# Patient Record
Sex: Male | Born: 1950 | Race: White | Hispanic: No | Marital: Single | Smoking: Former smoker
Health system: Southern US, Community
[De-identification: ages and names within clinical notes are randomized; demographics above are authoritative.]

## PROBLEM LIST (undated history)

## (undated) DIAGNOSIS — K2289 Other specified disease of esophagus: Secondary | ICD-10-CM

## (undated) DIAGNOSIS — I10 Essential (primary) hypertension: Secondary | ICD-10-CM

## (undated) DIAGNOSIS — K219 Gastro-esophageal reflux disease without esophagitis: Secondary | ICD-10-CM

## (undated) DIAGNOSIS — K228 Other specified diseases of esophagus: Secondary | ICD-10-CM

## (undated) DIAGNOSIS — H9190 Unspecified hearing loss, unspecified ear: Secondary | ICD-10-CM

## (undated) HISTORY — PX: EYE SURGERY: SHX253

---

## 2007-09-09 ENCOUNTER — Ambulatory Visit (HOSPITAL_COMMUNITY): Admission: RE | Admit: 2007-09-09 | Discharge: 2007-09-09 | Payer: Self-pay | Admitting: Family Medicine

## 2018-04-04 ENCOUNTER — Other Ambulatory Visit: Payer: Self-pay

## 2018-04-04 ENCOUNTER — Encounter (HOSPITAL_COMMUNITY): Payer: Self-pay | Admitting: *Deleted

## 2018-04-04 ENCOUNTER — Emergency Department (HOSPITAL_COMMUNITY): Payer: Medicare Other

## 2018-04-04 ENCOUNTER — Emergency Department (HOSPITAL_COMMUNITY)
Admission: EM | Admit: 2018-04-04 | Discharge: 2018-04-04 | Disposition: A | Discharge status: Home / Self Care | Payer: Medicare Other | Source: Home / Self Care | Attending: Emergency Medicine | Admitting: Emergency Medicine

## 2018-04-04 DIAGNOSIS — R634 Abnormal weight loss: Secondary | ICD-10-CM | POA: Insufficient documentation

## 2018-04-04 DIAGNOSIS — Z79899 Other long term (current) drug therapy: Secondary | ICD-10-CM

## 2018-04-04 DIAGNOSIS — C3490 Malignant neoplasm of unspecified part of unspecified bronchus or lung: Secondary | ICD-10-CM | POA: Insufficient documentation

## 2018-04-04 DIAGNOSIS — K228 Other specified diseases of esophagus: Secondary | ICD-10-CM | POA: Insufficient documentation

## 2018-04-04 DIAGNOSIS — R05 Cough: Secondary | ICD-10-CM | POA: Insufficient documentation

## 2018-04-04 DIAGNOSIS — K222 Esophageal obstruction: Secondary | ICD-10-CM | POA: Diagnosis not present

## 2018-04-04 DIAGNOSIS — R918 Other nonspecific abnormal finding of lung field: Secondary | ICD-10-CM

## 2018-04-04 DIAGNOSIS — Z87891 Personal history of nicotine dependence: Secondary | ICD-10-CM | POA: Insufficient documentation

## 2018-04-04 DIAGNOSIS — K229 Disease of esophagus, unspecified: Secondary | ICD-10-CM

## 2018-04-04 DIAGNOSIS — E860 Dehydration: Secondary | ICD-10-CM | POA: Diagnosis not present

## 2018-04-04 DIAGNOSIS — D491 Neoplasm of unspecified behavior of respiratory system: Secondary | ICD-10-CM

## 2018-04-04 HISTORY — DX: Unspecified hearing loss, unspecified ear: H91.90

## 2018-04-04 LAB — CBC
HEMATOCRIT: 50.7 % (ref 39.0–52.0)
HEMOGLOBIN: 16.7 g/dL (ref 13.0–17.0)
MCH: 32 pg (ref 26.0–34.0)
MCHC: 32.9 g/dL (ref 30.0–36.0)
MCV: 97.1 fL (ref 78.0–100.0)
Platelets: 228 10*3/uL (ref 150–400)
RBC: 5.22 MIL/uL (ref 4.22–5.81)
RDW: 12.5 % (ref 11.5–15.5)
WBC: 9.2 10*3/uL (ref 4.0–10.5)

## 2018-04-04 LAB — COMPREHENSIVE METABOLIC PANEL
ALBUMIN: 4 g/dL (ref 3.5–5.0)
ALT: 23 U/L (ref 17–63)
ANION GAP: 13 (ref 5–15)
AST: 19 U/L (ref 15–41)
Alkaline Phosphatase: 84 U/L (ref 38–126)
BILIRUBIN TOTAL: 1.3 mg/dL — AB (ref 0.3–1.2)
BUN: 26 mg/dL — ABNORMAL HIGH (ref 6–20)
CO2: 28 mmol/L (ref 22–32)
Calcium: 10.4 mg/dL — ABNORMAL HIGH (ref 8.9–10.3)
Chloride: 104 mmol/L (ref 101–111)
Creatinine, Ser: 1.05 mg/dL (ref 0.61–1.24)
GFR calc Af Amer: >60 mL/min (ref >60)
Glucose, Bld: 115 mg/dL — ABNORMAL HIGH (ref 65–99)
POTASSIUM: 4.3 mmol/L (ref 3.5–5.1)
Sodium: 145 mmol/L (ref 135–145)
TOTAL PROTEIN: 7.7 g/dL (ref 6.5–8.1)

## 2018-04-04 LAB — URINALYSIS, ROUTINE W REFLEX MICROSCOPIC
BACTERIA UA: NONE SEEN
Bilirubin Urine: NEGATIVE
GLUCOSE, UA: NEGATIVE mg/dL
HGB URINE DIPSTICK: NEGATIVE
KETONES UR: 20 mg/dL — AB
LEUKOCYTES UA: NEGATIVE
NITRITE: NEGATIVE
PROTEIN: 30 mg/dL — AB
Specific Gravity, Urine: 1.026 (ref 1.005–1.030)
pH: 5 (ref 5.0–8.0)

## 2018-04-04 LAB — LIPASE, BLOOD: Lipase: 32 U/L (ref 11–51)

## 2018-04-04 MED ORDER — IOPAMIDOL (ISOVUE-300) INJECTION 61%
100.0000 mL | Freq: Once | INTRAVENOUS | Status: AC | PRN
  Administered 2018-04-04: 100 mL via INTRAVENOUS

## 2018-04-04 MED ORDER — OMEPRAZOLE 20 MG PO CPDR: 20.0000 mg | DELAYED_RELEASE_CAPSULE | Freq: Two times a day (BID) | ORAL | 0 refills | Status: DC

## 2018-04-04 NOTE — ED Triage Notes (Signed)
Pt "spitting up" phlegm couple of months.  Pt lives alone at home, pt admits to eating crackers and has become weaker, denies diarrhea.

## 2018-04-04 NOTE — ED Notes (Signed)
Patient transported to X-ray 

## 2018-04-04 NOTE — ED Notes (Signed)
Unable to reach case management, voicemail full.  Attempted central scheduling.  Dr. Jeneen Rinks wanted to make sure this pt's biopsy did not get postponed or missed due to scheduling error. Pt informed I will contact him tomorrow to ensure he was able to reach central scheduling for arrangements.

## 2018-04-04 NOTE — ED Provider Notes (Signed)
[facility] EMERGENCY DEPARTMENT Provider Note   CSN: [id number] Arrival date & time: 04/04/18  1221     History   Chief Complaint Chief Complaint  Patient presents with  . Emesis    HPI Joshua Schwartz is a 67 y.o. male.  Complaint is weight loss, cough, poor appetite.  HPI: This is a 67 year old male who presents with the above multiple complaints.  He is a heavy smoker with greater than 50+-pack-year history.  He started "vaping" a few months ago in an attempt to stop smoking.  Describes 40 pound weight loss over the last "couple of months".  No hemoptysis.  No night sweats.  Poor appetite.  He states he has a chronic productive cough of clear sputum.  No emesis.  However he states that when he eats or drinks he feels full and occasionally does vomit.  Normal bowel habits.  Still urinating.  Past Medical History:  Diagnosis Date  . HOH (hard of hearing)     There are no active problems to display for this patient.   Past Surgical History:  Procedure Laterality Date  . EYE SURGERY          Home Medications    Prior to Admission medications   Medication Sig Start Date End Date Taking? Authorizing Provider  Aspirin-Acetaminophen-Caffeine (EXCEDRIN PO) Take 1 tablet by mouth every 6 (six) hours.   Yes [provider]  ibuprofen (ADVIL,MOTRIN) 200 MG tablet Take 200 mg by mouth every 6 (six) hours as needed.   Yes [provider]  omeprazole (PRILOSEC) 20 MG capsule Take 1 capsule (20 mg total) by mouth 2 (two) times daily. 04/04/18   Tanna Furry, MD    Family History History reviewed. No pertinent family history.  Social History Social History   Tobacco Use  . Smoking status: Former Research scientist (life sciences)  . Smokeless tobacco: Never Used  Substance Use Topics  . Alcohol use: Not Currently    Frequency: Never  . Drug use: Never     Allergies   Codeine   Review of Systems Review of Systems  Constitutional: Positive for activity change, appetite change,  fatigue and unexpected weight change. Negative for chills, diaphoresis and fever.  HENT: Positive for congestion. Negative for mouth sores, sore throat and trouble swallowing.   Eyes: Negative for visual disturbance.  Respiratory: Positive for cough. Negative for chest tightness, shortness of breath and wheezing.   Cardiovascular: Negative for chest pain.  Gastrointestinal: Positive for nausea and vomiting. Negative for abdominal distention, abdominal pain and diarrhea.  Endocrine: Negative for polydipsia, polyphagia and polyuria.  Genitourinary: Negative for dysuria, frequency and hematuria.  Musculoskeletal: Negative for gait problem.  Skin: Negative for color change, pallor and rash.  Neurological: Negative for dizziness, syncope, light-headedness and headaches.  Hematological: Does not bruise/bleed easily.  Psychiatric/Behavioral: Negative for behavioral problems and confusion.     Physical Exam Updated Vital Signs BP (!) 184/110 Comment: checked twice for accuracy.   Pulse 66   Temp 97.8 F (36.6 C) (Oral)   Resp 16   Ht 5\' 5"  (1.651 m)   Wt 75.8 kg (167 lb)   SpO2 97%   BMI 27.79 kg/m   Physical Exam  Constitutional: He is oriented to person, place, and time. No distress.  Thin.  Cachectic appearing 67 year old male.  Awake and alert.  Hard of hearing.  Wears hearing aids.  HENT:  Head: Normocephalic.  Conjunctive are not pale.  No scleral icterus  Eyes: Pupils are equal, round,  and reactive to light. Conjunctivae are normal. No scleral icterus.  Neck: Normal range of motion. Neck supple. No thyromegaly present.  Cardiovascular: Normal rate and regular rhythm. Exam reveals no gallop and no friction rub.  No murmur heard. Regular rhythm.  Pulmonary/Chest: Effort normal and breath sounds normal. No respiratory distress. He has no wheezes. He has no rales.  No crackles or rales.  No palpable lymph nodes in the neck, supraclavicular chain, or axilla  Abdominal: Soft. Bowel  sounds are normal. He exhibits no distension. There is no tenderness. There is no rebound.  Scaphoid soft.  No focal tenderness  Musculoskeletal: Normal range of motion.  Neurological: He is alert and oriented to person, place, and time.  Skin: Skin is warm and dry. No rash noted.  Psychiatric: He has a normal mood and affect. His behavior is normal.     ED Treatments / Results  Labs (all labs ordered are listed, but only abnormal results are displayed) Labs Reviewed  COMPREHENSIVE METABOLIC PANEL - Abnormal; Notable for the following components:      Result Value   Glucose, Bld 115 (*)    BUN 26 (*)    Calcium 10.4 (*)    Total Bilirubin 1.3 (*)    All other components within normal limits  URINALYSIS, ROUTINE W REFLEX MICROSCOPIC - Abnormal; Notable for the following components:   Color, Urine AMBER (*)    APPearance HAZY (*)    Ketones, ur 20 (*)    Protein, ur 30 (*)    All other components within normal limits  LIPASE, BLOOD  CBC    EKG None  Radiology Dg Chest 2 View  Result Date: 04/04/2018 CLINICAL DATA:  Cough, weight loss. EXAM: CHEST - 2 VIEW COMPARISON:  None. FINDINGS: 6 cm mass within the RIGHT upper lobe. LEFT lung is clear. Prominence of the RIGHT hilum could be due to associated lymphadenopathy or ectasia of the ascending thoracic aorta. Heart size is normal. No acute or suspicious osseous finding. IMPRESSION: RIGHT upper lobe mass measuring approximately 6 cm greatest dimension. This is almost certainly a lung cancer. Recommend chest CT for further characterization and to exclude associated mediastinal and/or perihilar lymphadenopathy. Electronically Signed   By: Franki Cabot M.D.   On: 04/04/2018 13:25   Ct Chest W Contrast  Result Date: 04/04/2018 CLINICAL DATA:  Productive cough for the last several months. Worsening weakness. Weight loss. EXAM: CT CHEST, ABDOMEN, AND PELVIS WITH CONTRAST TECHNIQUE: Multidetector CT imaging of the chest, abdomen and pelvis  was performed following the standard protocol during bolus administration of intravenous contrast. CONTRAST:  135mL ISOVUE-300 IOPAMIDOL (ISOVUE-300) INJECTION 61% COMPARISON:  Radiography same day. FINDINGS: CT CHEST FINDINGS Cardiovascular: Heart size is normal. No pericardial fluid. There is aortic atherosclerosis. There is aneurysmal dilatation of the ascending aorta with a maximal transverse diameter of 4.4 cm. There is extensive coronary artery calcification. Mediastinum/Nodes: There is right hilar lymphadenopathy measuring up to 3 cm. No mediastinal adenopathy. Esophagus is dilated and fluid-filled. There appears to be esophageal thickening distally. The possibility of esophageal carcinoma or stoppage I today is does exist. Lungs/Pleura: There is a partially necrotic peripheral right upper lobe mass measuring up to 5 cm in diameter consistent with primary lung cancer. There is benign appearing pleural and parenchymal scarring at both lung apices the tumor has a broad surface along the pleura. At 1 point, 1 could question if there is some nodular tumor extension into the region of the parietal pleura. Patient does  not have any pleural fluid however. Musculoskeletal: Ordinary degenerative changes affect the thoracic spine. No sign of metastatic disease in the thorax. CT ABDOMEN PELVIS FINDINGS Hepatobiliary: Fatty change of the liver. No focal liver lesion is seen. No calcified gallstones. Pancreas: Normal Spleen: Normal Adrenals/Urinary Tract: No adrenal mass. Kidneys are normal. Bladder is normal. Stomach/Bowel: Sigmoid diverticulosis without evidence of diverticulitis. No acute or significant bowel finding in the abdomen. Vascular/Lymphatic: Extensive atherosclerotic disease of the abdominal aorta and its branch vessels. Infrarenal abdominal aortic aneurysm with maximal transverse diameter of 2.8 cm. 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 Maximal diameter on the  right is 1.7 cm. IVC is normal. No retroperitoneal adenopathy. Reproductive: Negative Other: No free fluid or air. Musculoskeletal: Lower lumbar degenerative changes. No sign of metastatic disease in the lumbosacral spine or the pelvis. IMPRESSION: 5 cm partially necrotic mass in the posterior right upper lobe with a broad surface along the pleura. Question nodular pleural invasion. No pleural fluid however. Metastatic adenopathy in the right hilum measuring up to 3 cm. Thickening of the distal esophagus with dilated fluid filled esophagus proximal to that. This raises the possibility of a distal esophageal carcinoma. Findings could be seen with esophagitis. Aortic atherosclerosis. 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 Coronary artery calcification. Abdominal aortic atherosclerosis. Infrarenal abdominal aortic aneurysm with maximal diameter of 2.8 cm. Electronically Signed   By: Nelson Chimes M.D.   On: 04/04/2018 14:47   Ct Abdomen Pelvis W Contrast  Result Date: 04/04/2018 CLINICAL DATA:  Productive cough for the last several months. Worsening weakness. Weight loss. EXAM: CT CHEST, ABDOMEN, AND PELVIS WITH CONTRAST TECHNIQUE: Multidetector CT imaging of the chest, abdomen and pelvis was performed following the standard protocol during bolus administration of intravenous contrast. CONTRAST:  150mL ISOVUE-300 IOPAMIDOL (ISOVUE-300) INJECTION 61% COMPARISON:  Radiography same day. FINDINGS: CT CHEST FINDINGS Cardiovascular: Heart size is normal. No pericardial fluid. There is aortic atherosclerosis. There is aneurysmal dilatation of the ascending aorta with a maximal transverse diameter of 4.4 cm. There is extensive coronary artery calcification. Mediastinum/Nodes: There is right hilar lymphadenopathy measuring up to 3 cm. No mediastinal adenopathy. Esophagus is dilated and fluid-filled. There appears to be esophageal thickening distally. The possibility of esophageal carcinoma or stoppage I today  is does exist. Lungs/Pleura: There is a partially necrotic peripheral right upper lobe mass measuring up to 5 cm in diameter consistent with primary lung cancer. There is benign appearing pleural and parenchymal scarring at both lung apices the tumor has a broad surface along the pleura. At 1 point, 1 could question if there is some nodular tumor extension into the region of the parietal pleura. Patient does not have any pleural fluid however. Musculoskeletal: Ordinary degenerative changes affect the thoracic spine. No sign of metastatic disease in the thorax. CT ABDOMEN PELVIS FINDINGS Hepatobiliary: Fatty change of the liver. No focal liver lesion is seen. No calcified gallstones. Pancreas: Normal Spleen: Normal Adrenals/Urinary Tract: No adrenal mass. Kidneys are normal. Bladder is normal. Stomach/Bowel: Sigmoid diverticulosis without evidence of diverticulitis. No acute or significant bowel finding in the abdomen. Vascular/Lymphatic: Extensive atherosclerotic disease of the abdominal aorta and its branch vessels. Infrarenal abdominal aortic aneurysm with maximal transverse diameter of 2.8 cm. 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 Maximal diameter on the right is 1.7 cm. IVC is normal. No retroperitoneal adenopathy. Reproductive: Negative Other: No free fluid or air. Musculoskeletal: Lower lumbar degenerative changes. No sign of  metastatic disease in the lumbosacral spine or the pelvis. IMPRESSION: 5 cm partially necrotic mass in the posterior right upper lobe with a broad surface along the pleura. Question nodular pleural invasion. No pleural fluid however. Metastatic adenopathy in the right hilum measuring up to 3 cm. Thickening of the distal esophagus with dilated fluid filled esophagus proximal to that. This raises the possibility of a distal esophageal carcinoma. Findings could be seen with esophagitis. Aortic atherosclerosis. Aneurysmal dilatation of the  ascending aorta to 4.4 cm. Coronary artery calcification. Abdominal aortic atherosclerosis. Infrarenal abdominal aortic aneurysm with maximal diameter of 2.8 cm. Electronically Signed   By: Nelson Chimes M.D.   On: 04/04/2018 14:47    Procedures Procedures (including critical care time)  Medications Ordered in ED Medications  iopamidol (ISOVUE-300) 61 % injection 100 mL (100 mLs Intravenous Contrast Given 04/04/18 1418)     Initial Impression / Assessment and Plan / ED Course  I have reviewed the triage vital signs and the nursing notes.  Pertinent labs & imaging results that were available during my care of the patient were reviewed by me and considered in my medical decision making (see chart for details).    Weight loss and B symptoms and heavy smoker.  Concern for lung disease.  X-ray unfortunately shows a round right upper lobe 6 cm mass.  CT confirms probable malignancy with mediastinal nodes.  Thickening of the distal esophagus with some pooling.  He is able to drink liquids in the ER.  Does have an occasional cough of some clear sputum.  I have made multiple attempts to try to secure a time for patient to return for CT-guided biopsy of his pleural-based right lower lobe mass.  I discussed the case with Dr.Katragadda, college he had any pain.  They will contact the patient for appointment.  Ordered IR guided CT biopsy.  Number for IR department at [facility].  We will follow-up from here tomorrow, Monday/weekday to try to ensure follow-up and secure appointments of the patient can have biopsy to initiate early treatment.  I discussed with him the possibility of a second diagnosis of esophageal cancer.  He is able to swallow although he does occasionally pull some secretions as noted on CT.  No signs of aspiration.  Will and institute Prilosec.  Maintain liquids and low residue diet to avoid impaction.  GI follow-up.  Oncology follow-up.  Final Clinical Impressions(s) / ED Diagnoses   Final  diagnoses:  Lung mass  Lung tumor  Esophagus disorder    ED Discharge Orders        Ordered    omeprazole (PRILOSEC) 20 MG capsule  2 times daily     04/04/18 1656       Tanna Furry, MD 04/04/18 2040

## 2018-04-04 NOTE — Discharge Instructions (Signed)
Call Dr. Delton Coombes tomorrow to make appointment. You should receive a call from [facility] radiology department tomorrow to schedule you a time for a biopsy. Fill Rx for prilosec and take twice per day.

## 2018-04-04 NOTE — ED Notes (Signed)
Pt states that he lost about 40 lbs in the last two months.

## 2018-04-04 NOTE — ED Notes (Signed)
Pt returned from CT °

## 2018-04-05 ENCOUNTER — Inpatient Hospital Stay (HOSPITAL_COMMUNITY)
Admission: EM | Admit: 2018-04-05 | Discharge: 2018-04-08 | DRG: 391 | Disposition: A | Discharge status: Home / Self Care | Payer: Medicare Other | Attending: Family Medicine | Admitting: Family Medicine

## 2018-04-05 ENCOUNTER — Other Ambulatory Visit: Payer: Self-pay

## 2018-04-05 ENCOUNTER — Encounter (HOSPITAL_COMMUNITY): Payer: Self-pay | Admitting: Emergency Medicine

## 2018-04-05 DIAGNOSIS — H9190 Unspecified hearing loss, unspecified ear: Secondary | ICD-10-CM | POA: Diagnosis present

## 2018-04-05 DIAGNOSIS — K311 Adult hypertrophic pyloric stenosis: Secondary | ICD-10-CM | POA: Diagnosis not present

## 2018-04-05 DIAGNOSIS — F17210 Nicotine dependence, cigarettes, uncomplicated: Secondary | ICD-10-CM | POA: Diagnosis present

## 2018-04-05 DIAGNOSIS — E43 Unspecified severe protein-calorie malnutrition: Secondary | ICD-10-CM | POA: Diagnosis present

## 2018-04-05 DIAGNOSIS — Z23 Encounter for immunization: Secondary | ICD-10-CM

## 2018-04-05 DIAGNOSIS — Z720 Tobacco use: Secondary | ICD-10-CM | POA: Diagnosis not present

## 2018-04-05 DIAGNOSIS — R634 Abnormal weight loss: Secondary | ICD-10-CM

## 2018-04-05 DIAGNOSIS — K222 Esophageal obstruction: Secondary | ICD-10-CM

## 2018-04-05 DIAGNOSIS — I712 Thoracic aortic aneurysm, without rupture: Secondary | ICD-10-CM | POA: Diagnosis present

## 2018-04-05 DIAGNOSIS — R1310 Dysphagia, unspecified: Secondary | ICD-10-CM

## 2018-04-05 DIAGNOSIS — T18128A Food in esophagus causing other injury, initial encounter: Secondary | ICD-10-CM | POA: Diagnosis not present

## 2018-04-05 DIAGNOSIS — E860 Dehydration: Secondary | ICD-10-CM | POA: Diagnosis present

## 2018-04-05 DIAGNOSIS — I719 Aortic aneurysm of unspecified site, without rupture: Secondary | ICD-10-CM

## 2018-04-05 DIAGNOSIS — I714 Abdominal aortic aneurysm, without rupture: Secondary | ICD-10-CM | POA: Diagnosis present

## 2018-04-05 DIAGNOSIS — K219 Gastro-esophageal reflux disease without esophagitis: Secondary | ICD-10-CM

## 2018-04-05 DIAGNOSIS — I10 Essential (primary) hypertension: Secondary | ICD-10-CM | POA: Diagnosis present

## 2018-04-05 DIAGNOSIS — Z7982 Long term (current) use of aspirin: Secondary | ICD-10-CM

## 2018-04-05 DIAGNOSIS — Z681 Body mass index (BMI) 19 or less, adult: Secondary | ICD-10-CM | POA: Diagnosis not present

## 2018-04-05 DIAGNOSIS — Z87891 Personal history of nicotine dependence: Secondary | ICD-10-CM | POA: Diagnosis not present

## 2018-04-05 DIAGNOSIS — R112 Nausea with vomiting, unspecified: Secondary | ICD-10-CM

## 2018-04-05 DIAGNOSIS — R918 Other nonspecific abnormal finding of lung field: Secondary | ICD-10-CM | POA: Diagnosis not present

## 2018-04-05 DIAGNOSIS — Z885 Allergy status to narcotic agent status: Secondary | ICD-10-CM | POA: Diagnosis not present

## 2018-04-05 HISTORY — DX: Other specified disease of esophagus: K22.89

## 2018-04-05 HISTORY — DX: Other specified diseases of esophagus: K22.8

## 2018-04-05 HISTORY — DX: Gastro-esophageal reflux disease without esophagitis: K21.9

## 2018-04-05 HISTORY — DX: Essential (primary) hypertension: I10

## 2018-04-05 LAB — COMPREHENSIVE METABOLIC PANEL
ALBUMIN: 4.1 g/dL (ref 3.5–5.0)
ALK PHOS: 87 U/L (ref 38–126)
ALT: 28 U/L (ref 17–63)
AST: 28 U/L (ref 15–41)
Anion gap: 15 (ref 5–15)
BUN: 33 mg/dL — AB (ref 6–20)
CALCIUM: 10.4 mg/dL — AB (ref 8.9–10.3)
CO2: 26 mmol/L (ref 22–32)
CREATININE: 1.14 mg/dL (ref 0.61–1.24)
Chloride: 107 mmol/L (ref 101–111)
GFR calc Af Amer: >60 mL/min (ref >60)
GFR calc non Af Amer: >60 mL/min (ref >60)
GLUCOSE: 106 mg/dL — AB (ref 65–99)
Potassium: 4.4 mmol/L (ref 3.5–5.1)
SODIUM: 148 mmol/L — AB (ref 135–145)
Total Bilirubin: 1.2 mg/dL (ref 0.3–1.2)
Total Protein: 7.4 g/dL (ref 6.5–8.1)

## 2018-04-05 LAB — CBC WITH DIFFERENTIAL/PLATELET
Basophils Absolute: 0.1 10*3/uL (ref 0.0–0.1)
Basophils Relative: 1 %
EOS ABS: 0.1 10*3/uL (ref 0.0–0.7)
Eosinophils Relative: 1 %
HCT: 48.4 % (ref 39.0–52.0)
HEMOGLOBIN: 15.7 g/dL (ref 13.0–17.0)
LYMPHS ABS: 0.9 10*3/uL (ref 0.7–4.0)
Lymphocytes Relative: 9 %
MCH: 31.8 pg (ref 26.0–34.0)
MCHC: 32.4 g/dL (ref 30.0–36.0)
MCV: 98 fL (ref 78.0–100.0)
Monocytes Absolute: 0.7 10*3/uL (ref 0.1–1.0)
Monocytes Relative: 7 %
NEUTROS PCT: 82 %
Neutro Abs: 8.4 10*3/uL — ABNORMAL HIGH (ref 1.7–7.7)
Platelets: 233 10*3/uL (ref 150–400)
RBC: 4.94 MIL/uL (ref 4.22–5.81)
RDW: 12.7 % (ref 11.5–15.5)
WBC: 10.1 10*3/uL (ref 4.0–10.5)

## 2018-04-05 MED ORDER — ACETAMINOPHEN 325 MG PO TABS
650.0000 mg | ORAL_TABLET | Freq: Four times a day (QID) | ORAL | Status: DC | PRN
  Administered 2018-04-06 – 2018-04-08 (×5): 650 mg via ORAL
  Filled 2018-04-05 (×5): qty 2

## 2018-04-05 MED ORDER — ACETAMINOPHEN 650 MG RE SUPP: 650.0000 mg | Freq: Four times a day (QID) | RECTAL | Status: DC | PRN

## 2018-04-05 MED ORDER — FAMOTIDINE IN NACL 20-0.9 MG/50ML-% IV SOLN
20.0000 mg | Freq: Two times a day (BID) | INTRAVENOUS | Status: DC
  Administered 2018-04-05 – 2018-04-08 (×6): 20 mg via INTRAVENOUS
  Filled 2018-04-05 (×6): qty 50

## 2018-04-05 MED ORDER — ONDANSETRON HCL 4 MG/2ML IJ SOLN
4.0000 mg | Freq: Four times a day (QID) | INTRAMUSCULAR | Status: DC | PRN
  Administered 2018-04-05: 4 mg via INTRAVENOUS
  Filled 2018-04-05: qty 2

## 2018-04-05 MED ORDER — HYDRALAZINE HCL 20 MG/ML IJ SOLN
10.0000 mg | INTRAMUSCULAR | Status: DC | PRN
  Administered 2018-04-05 – 2018-04-07 (×2): 10 mg via INTRAVENOUS
  Filled 2018-04-05 (×2): qty 1

## 2018-04-05 MED ORDER — NICOTINE 7 MG/24HR TD PT24
7.0000 mg | MEDICATED_PATCH | Freq: Every day | TRANSDERMAL | Status: DC
  Administered 2018-04-05 – 2018-04-08 (×4): 7 mg via TRANSDERMAL
  Filled 2018-04-05 (×4): qty 1

## 2018-04-05 MED ORDER — LACTATED RINGERS IV SOLN
INTRAVENOUS | Status: AC
  Administered 2018-04-05: via INTRAVENOUS

## 2018-04-05 MED ORDER — ONDANSETRON HCL 4 MG PO TABS
4.0000 mg | ORAL_TABLET | Freq: Four times a day (QID) | ORAL | Status: DC | PRN
  Administered 2018-04-08: 4 mg via ORAL
  Filled 2018-04-05: qty 1

## 2018-04-05 MED ORDER — SODIUM CHLORIDE 0.9 % IV BOLUS
1000.0000 mL | Freq: Once | INTRAVENOUS | Status: AC
  Administered 2018-04-05: 1000 mL via INTRAVENOUS

## 2018-04-05 NOTE — ED Triage Notes (Signed)
Pt has not been able to keep anything down since being discharged yesterday.  Was dx with lung mass yesterday by Dr. Jeneen Rinks and is supposed to schedule biopsy.

## 2018-04-05 NOTE — ED Notes (Signed)
Apresoline given for BP of 175/115

## 2018-04-05 NOTE — H&P (Addendum)
History and Physical    Joshua Schwartz UVO:[id number] DOB: 1951-01-01 DOA: 04/05/2018  PCP: Patient, No Pcp Per   Patient coming from: Home  Chief Complaint: N/V  HPI: Joshua Schwartz is a 67 y.o. male with medical history significant for significant tobacco abuse and GERD who presented to the ED yesterday with complaints of nausea and vomiting along with unintentional weight loss as well as poor appetite and cough.  He had multiple imaging studies done with CT chest as well as abdomen and pelvis demonstrating round right upper lobe 6 cm mass as well as possible malignancy with mediastinal nodes with concern of distal esophagitis.  There was also noted aneurysm of the ascending and descending aorta.  He was being set up for outpatient CT-guided biopsy and was sent home, but unfortunately continues to have difficulty with oral intake of both solids and liquids and has a persistent burning sensation to his substernal chest and epigastric region.  He has not been able to keep anything down and therefore returns to the ED.   ED Course: Vital signs are stable, but patient has elevated blood pressure readings of 742 systolic and over 595 diastolic.  Laboratory data unremarkable aside from mild sodium elevation of 148 and BUN 33.  Creatinine appears to be near baseline at 1.14 with prior 1.05.  Glucose is 106.  Imaging studies were reviewed with findings of right upper lobe mass as well as thickening of distal esophagus and aortic aneurysm is noted.  Patient has been given 1 L normal saline bolus.  Review of Systems: All others reviewed and otherwise negative.  Past Medical History:  Diagnosis Date  . HOH (hard of hearing)     Past Surgical History:  Procedure Laterality Date  . EYE SURGERY       reports that he has quit smoking. He has never used smokeless tobacco. He reports that he drank alcohol. He reports that he does not use drugs.  Allergies  Allergen Reactions  . Codeine     Altered mental  status    History reviewed. No pertinent family history.  Prior to Admission medications   Medication Sig Start Date End Date Taking? Authorizing Provider  Aspirin-Acetaminophen-Caffeine (EXCEDRIN PO) Take 1 tablet by mouth every 6 (six) hours.   Yes [provider]  ibuprofen (ADVIL,MOTRIN) 200 MG tablet Take 200 mg by mouth every 6 (six) hours as needed.   Yes [provider]  omeprazole (PRILOSEC) 20 MG capsule Take 1 capsule (20 mg total) by mouth 2 (two) times daily. 04/04/18  Yes Tanna Furry, MD    Physical Exam: Vitals:   04/05/18 1325 04/05/18 1530 04/05/18 1630 04/05/18 1730  BP: (!) 184/114 (!) 187/117 (!) 183/122 (!) 171/119  Pulse: 91 78  76  Resp: 16 14  18   Temp: 97.8 F (36.6 C)     TempSrc: Oral     SpO2: 99% 97%  99%  Weight: 45.3 kg (99 lb 14.4 oz)     Height: 5\' 5"  (1.651 m)       Constitutional: NAD, calm, comfortable, cachectic Vitals:   04/05/18 1325 04/05/18 1530 04/05/18 1630 04/05/18 1730  BP: (!) 184/114 (!) 187/117 (!) 183/122 (!) 171/119  Pulse: 91 78  76  Resp: 16 14  18   Temp: 97.8 F (36.6 C)     TempSrc: Oral     SpO2: 99% 97%  99%  Weight: 45.3 kg (99 lb 14.4 oz)     Height: 5\' 5"  (1.651  m)      Eyes: lids and conjunctivae normal ENMT: Mucous membranes are moist.  Hard of hearing Neck: normal, supple Respiratory: clear to auscultation bilaterally. Normal respiratory effort. No accessory muscle use.  Cardiovascular: Regular rate and rhythm, no murmurs. No extremity edema. Abdomen: no tenderness, no distention. Bowel sounds positive.  Musculoskeletal:  No joint deformity upper and lower extremities.   Skin: no rashes, lesions, ulcers.   Labs on Admission: I have personally reviewed following labs and imaging studies  CBC: Recent Labs  Lab 04/04/18 1245 04/05/18 1513  WBC 9.2 10.1  NEUTROABS  --  8.4*  HGB 16.7 15.7  HCT 50.7 48.4  MCV 97.1 98.0  PLT 228 269   Basic Metabolic Panel: Recent Labs  Lab  04/04/18 1245 04/05/18 1513  NA 145 148*  K 4.3 4.4  CL 104 107  CO2 28 26  GLUCOSE 115* 106*  BUN 26* 33*  CREATININE 1.05 1.14  CALCIUM 10.4* 10.4*   GFR: Estimated Creatinine Clearance: 41.4 mL/min (by C-G formula based on SCr of 1.14 mg/dL). Liver Function Tests: Recent Labs  Lab 04/04/18 1245 04/05/18 1513  AST 19 28  ALT 23 28  ALKPHOS 84 87  BILITOT 1.3* 1.2  PROT 7.7 7.4  ALBUMIN 4.0 4.1   Recent Labs  Lab 04/04/18 1245  LIPASE 32   No results for input(s): AMMONIA in the last 168 hours. Coagulation Profile: No results for input(s): INR, PROTIME in the last 168 hours. Cardiac Enzymes: No results for input(s): CKTOTAL, CKMB, CKMBINDEX, TROPONINI in the last 168 hours. BNP (last 3 results) No results for input(s): PROBNP in the last 8760 hours. HbA1C: No results for input(s): HGBA1C in the last 72 hours. CBG: No results for input(s): GLUCAP in the last 168 hours. Lipid Profile: No results for input(s): CHOL, HDL, LDLCALC, TRIG, CHOLHDL, LDLDIRECT in the last 72 hours. Thyroid Function Tests: No results for input(s): TSH, T4TOTAL, FREET4, T3FREE, THYROIDAB in the last 72 hours. Anemia Panel: No results for input(s): VITAMINB12, FOLATE, FERRITIN, TIBC, IRON, RETICCTPCT in the last 72 hours. Urine analysis:    Component Value Date/Time   COLORURINE AMBER (A) 04/04/2018 1400   APPEARANCEUR HAZY (A) 04/04/2018 1400   LABSPEC 1.026 04/04/2018 1400   PHURINE 5.0 04/04/2018 1400   GLUCOSEU NEGATIVE 04/04/2018 1400   HGBUR NEGATIVE 04/04/2018 1400   BILIRUBINUR NEGATIVE 04/04/2018 1400   KETONESUR 20 (A) 04/04/2018 1400   PROTEINUR 30 (A) 04/04/2018 1400   NITRITE NEGATIVE 04/04/2018 1400   LEUKOCYTESUR NEGATIVE 04/04/2018 1400    Radiological Exams on Admission: Dg Chest 2 View  Result Date: 04/04/2018 CLINICAL DATA:  Cough, weight loss. EXAM: CHEST - 2 VIEW COMPARISON:  None. FINDINGS: 6 cm mass within the RIGHT upper lobe. LEFT lung is clear.  Prominence of the RIGHT hilum could be due to associated lymphadenopathy or ectasia of the ascending thoracic aorta. Heart size is normal. No acute or suspicious osseous finding. IMPRESSION: RIGHT upper lobe mass measuring approximately 6 cm greatest dimension. This is almost certainly a lung cancer. Recommend chest CT for further characterization and to exclude associated mediastinal and/or perihilar lymphadenopathy. Electronically Signed   By: Franki Cabot M.D.   On: 04/04/2018 13:25   Ct Chest W Contrast  Result Date: 04/04/2018 CLINICAL DATA:  Productive cough for the last several months. Worsening weakness. Weight loss. EXAM: CT CHEST, ABDOMEN, AND PELVIS WITH CONTRAST TECHNIQUE: Multidetector CT imaging of the chest, abdomen and pelvis was performed following the standard protocol  during bolus administration of intravenous contrast. CONTRAST:  13mL ISOVUE-300 IOPAMIDOL (ISOVUE-300) INJECTION 61% COMPARISON:  Radiography same day. FINDINGS: CT CHEST FINDINGS Cardiovascular: Heart size is normal. No pericardial fluid. There is aortic atherosclerosis. There is aneurysmal dilatation of the ascending aorta with a maximal transverse diameter of 4.4 cm. There is extensive coronary artery calcification. Mediastinum/Nodes: There is right hilar lymphadenopathy measuring up to 3 cm. No mediastinal adenopathy. Esophagus is dilated and fluid-filled. There appears to be esophageal thickening distally. The possibility of esophageal carcinoma or stoppage I today is does exist. Lungs/Pleura: There is a partially necrotic peripheral right upper lobe mass measuring up to 5 cm in diameter consistent with primary lung cancer. There is benign appearing pleural and parenchymal scarring at both lung apices the tumor has a broad surface along the pleura. At 1 point, 1 could question if there is some nodular tumor extension into the region of the parietal pleura. Patient does not have any pleural fluid however. Musculoskeletal:  Ordinary degenerative changes affect the thoracic spine. No sign of metastatic disease in the thorax. CT ABDOMEN PELVIS FINDINGS Hepatobiliary: Fatty change of the liver. No focal liver lesion is seen. No calcified gallstones. Pancreas: Normal Spleen: Normal Adrenals/Urinary Tract: No adrenal mass. Kidneys are normal. Bladder is normal. Stomach/Bowel: Sigmoid diverticulosis without evidence of diverticulitis. No acute or significant bowel finding in the abdomen. Vascular/Lymphatic: Extensive atherosclerotic disease of the abdominal aorta and its branch vessels. Infrarenal abdominal aortic aneurysm with maximal transverse diameter of 2.8 cm. 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 Maximal diameter on the right is 1.7 cm. IVC is normal. No retroperitoneal adenopathy. Reproductive: Negative Other: No free fluid or air. Musculoskeletal: Lower lumbar degenerative changes. No sign of metastatic disease in the lumbosacral spine or the pelvis. IMPRESSION: 5 cm partially necrotic mass in the posterior right upper lobe with a broad surface along the pleura. Question nodular pleural invasion. No pleural fluid however. Metastatic adenopathy in the right hilum measuring up to 3 cm. Thickening of the distal esophagus with dilated fluid filled esophagus proximal to that. This raises the possibility of a distal esophageal carcinoma. Findings could be seen with esophagitis. Aortic atherosclerosis. 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 Coronary artery calcification. Abdominal aortic atherosclerosis. Infrarenal abdominal aortic aneurysm with maximal diameter of 2.8 cm. Electronically Signed   By: Nelson Chimes M.D.   On: 04/04/2018 14:47   Ct Abdomen Pelvis W Contrast  Result Date: 04/04/2018 CLINICAL DATA:  Productive cough for the last several months. Worsening weakness. Weight loss. EXAM: CT CHEST, ABDOMEN, AND PELVIS WITH CONTRAST TECHNIQUE: Multidetector CT imaging  of the chest, abdomen and pelvis was performed following the standard protocol during bolus administration of intravenous contrast. CONTRAST:  118mL ISOVUE-300 IOPAMIDOL (ISOVUE-300) INJECTION 61% COMPARISON:  Radiography same day. FINDINGS: CT CHEST FINDINGS Cardiovascular: Heart size is normal. No pericardial fluid. There is aortic atherosclerosis. There is aneurysmal dilatation of the ascending aorta with a maximal transverse diameter of 4.4 cm. There is extensive coronary artery calcification. Mediastinum/Nodes: There is right hilar lymphadenopathy measuring up to 3 cm. No mediastinal adenopathy. Esophagus is dilated and fluid-filled. There appears to be esophageal thickening distally. The possibility of esophageal carcinoma or stoppage I today is does exist. Lungs/Pleura: There is a partially necrotic peripheral right upper lobe mass measuring up to 5 cm in diameter consistent with primary lung cancer. There is benign appearing pleural and parenchymal scarring at both lung apices the tumor has a broad surface  along the pleura. At 1 point, 1 could question if there is some nodular tumor extension into the region of the parietal pleura. Patient does not have any pleural fluid however. Musculoskeletal: Ordinary degenerative changes affect the thoracic spine. No sign of metastatic disease in the thorax. CT ABDOMEN PELVIS FINDINGS Hepatobiliary: Fatty change of the liver. No focal liver lesion is seen. No calcified gallstones. Pancreas: Normal Spleen: Normal Adrenals/Urinary Tract: No adrenal mass. Kidneys are normal. Bladder is normal. Stomach/Bowel: Sigmoid diverticulosis without evidence of diverticulitis. No acute or significant bowel finding in the abdomen. Vascular/Lymphatic: Extensive atherosclerotic disease of the abdominal aorta and its branch vessels. Infrarenal abdominal aortic aneurysm with maximal transverse diameter of 2.8 cm. Extensive iliac atherosclerotic disease with ectasia of the common carotid  arteries. Maximal diameter on the right is 1.7 cm. IVC is normal. No retroperitoneal adenopathy. Reproductive: Negative Other: No free fluid or air. Musculoskeletal: Lower lumbar degenerative changes. No sign of metastatic disease in the lumbosacral spine or the pelvis. IMPRESSION: 5 cm partially necrotic mass in the posterior right upper lobe with a broad surface along the pleura. Question nodular pleural invasion. No pleural fluid however. Metastatic adenopathy in the right hilum measuring up to 3 cm. Thickening of the distal esophagus with dilated fluid filled esophagus proximal to that. This raises the possibility of a distal esophageal carcinoma. Findings could be seen with esophagitis. Aortic atherosclerosis. 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 Coronary artery calcification. Abdominal aortic atherosclerosis. Infrarenal abdominal aortic aneurysm with maximal diameter of 2.8 cm. Electronically Signed   By: Nelson Chimes M.D.   On: 04/04/2018 14:47    Assessment/Plan Principal Problem:   Intractable nausea and vomiting Active Problems:   Tobacco abuse   GERD (gastroesophageal reflux disease)   Weight loss, abnormal   Aortic aneurysm ([place])    1. Intractable nausea and vomiting with dysphagia.  There are concerns for distal esophagitis versus esophageal cancer based on imaging studies.  Will place consult for gastroenterology to further evaluate with likely need for EGD.  Zofran as needed for nausea and vomiting.  Maintain on clear liquid diet with n.p.o. after midnight.  IV fluid hydration.  Will place on IV acid suppressive therapy. 2. Hypertension.  Does not currently appear to be in crisis.  Will monitor carefully and place on hydralazine pushes. 3. Lung mass.  Spoke with IR Dr. Anselm Pancoast who recommends PET-CT once discharged prior to biopsy. 4. Ascending aortic aneurysm and infrarenal abdominal aortic aneurysm.  Will need close surveillance and follow-up. 5. Tobacco abuse.   Nicotine patch.  Counseled on cessation.   DVT prophylaxis: SCDs Code Status: Full Family Communication: Friend at bedside Disposition Plan:Further evaluation per GI with possible EGD Consults called:GI in computer Admission status: Inpatient, [facility] Pager [phone]  If 7PM-7AM, please contact night-coverage [email or web address] Password [facility]  04/05/2018, 7:32 PM

## 2018-04-05 NOTE — ED Notes (Signed)
Pt states he has not been able to keep food or fluids down since d/c yesterday. Has taken his prilosec with minimal improvement.

## 2018-04-05 NOTE — ED Provider Notes (Signed)
[facility] EMERGENCY DEPARTMENT Provider Note   CSN: [id number] Arrival date & time: 04/05/18  1305     History   Chief Complaint Chief Complaint  Patient presents with  . Emesis    HPI Joshua Schwartz is a 67 y.o. male.  Patient states he has been vomiting and has not been able to keep anything down.  He was seen yesterday in the hospital and he has a mass in his lung and possible esophageal cancer.  The history is provided by the patient. No language interpreter was used.  Emesis   This is a new problem. The current episode started more than 2 days ago. The problem occurs 2 to 4 times per day. The problem has not changed since onset.The emesis has an appearance of stomach contents. There has been no fever. Fever duration: No fever. Pertinent negatives include no abdominal pain, no chills, no cough, no diarrhea and no headaches.    Past Medical History:  Diagnosis Date  . HOH (hard of hearing)     There are no active problems to display for this patient.   Past Surgical History:  Procedure Laterality Date  . EYE SURGERY          Home Medications    Prior to Admission medications   Medication Sig Start Date End Date Taking? Authorizing Provider  Aspirin-Acetaminophen-Caffeine (EXCEDRIN PO) Take 1 tablet by mouth every 6 (six) hours.   Yes [provider]  ibuprofen (ADVIL,MOTRIN) 200 MG tablet Take 200 mg by mouth every 6 (six) hours as needed.   Yes [provider]  omeprazole (PRILOSEC) 20 MG capsule Take 1 capsule (20 mg total) by mouth 2 (two) times daily. 04/04/18  Yes Tanna Furry, MD    Family History History reviewed. No pertinent family history.  Social History Social History   Tobacco Use  . Smoking status: Former Research scientist (life sciences)  . Smokeless tobacco: Never Used  Substance Use Topics  . Alcohol use: Not Currently    Frequency: Never  . Drug use: Never     Allergies   Codeine   Review of Systems Review of Systems  Constitutional:  Negative for appetite change, chills and fatigue.  HENT: Negative for congestion, ear discharge and sinus pressure.   Eyes: Negative for discharge.  Respiratory: Negative for cough.   Cardiovascular: Negative for chest pain.  Gastrointestinal: Positive for vomiting. Negative for abdominal pain and diarrhea.  Genitourinary: Negative for frequency and hematuria.  Musculoskeletal: Negative for back pain.  Skin: Negative for rash.  Neurological: Negative for seizures and headaches.  Psychiatric/Behavioral: Negative for hallucinations.     Physical Exam Updated Vital Signs BP (!) 171/119   Pulse 76   Temp 97.8 F (36.6 C) (Oral)   Resp 18   Ht 5\' 5"  (1.651 m)   Wt 45.3 kg (99 lb 14.4 oz)   SpO2 99%   BMI 16.62 kg/m   Physical Exam  Constitutional: He is oriented to person, place, and time.  Cachectic  HENT:  Head: Normocephalic.  Eyes: Conjunctivae and EOM are normal. No scleral icterus.  Neck: Neck supple. No thyromegaly present.  Cardiovascular: Normal rate and regular rhythm. Exam reveals no gallop and no friction rub.  No murmur heard. Pulmonary/Chest: No stridor. He has no wheezes. He has no rales. He exhibits no tenderness.  Abdominal: He exhibits no distension. There is no tenderness. There is no rebound.  Musculoskeletal: Normal range of motion. He exhibits no edema.  Lymphadenopathy:    He  has no cervical adenopathy.  Neurological: He is oriented to person, place, and time. He exhibits normal muscle tone. Coordination normal.  Skin: No rash noted. No erythema.  Psychiatric: He has a normal mood and affect. His behavior is normal.     ED Treatments / Results  Labs (all labs ordered are listed, but only abnormal results are displayed) Labs Reviewed  CBC WITH DIFFERENTIAL/PLATELET - Abnormal; Notable for the following components:      Result Value   Neutro Abs 8.4 (*)    All other components within normal limits  COMPREHENSIVE METABOLIC PANEL - Abnormal;  Notable for the following components:   Sodium 148 (*)    Glucose, Bld 106 (*)    BUN 33 (*)    Calcium 10.4 (*)    All other components within normal limits    EKG None  Radiology Dg Chest 2 View  Result Date: 04/04/2018 CLINICAL DATA:  Cough, weight loss. EXAM: CHEST - 2 VIEW COMPARISON:  None. FINDINGS: 6 cm mass within the RIGHT upper lobe. LEFT lung is clear. Prominence of the RIGHT hilum could be due to associated lymphadenopathy or ectasia of the ascending thoracic aorta. Heart size is normal. No acute or suspicious osseous finding. IMPRESSION: RIGHT upper lobe mass measuring approximately 6 cm greatest dimension. This is almost certainly a lung cancer. Recommend chest CT for further characterization and to exclude associated mediastinal and/or perihilar lymphadenopathy. Electronically Signed   By: Franki Cabot M.D.   On: 04/04/2018 13:25   Ct Chest W Contrast  Result Date: 04/04/2018 CLINICAL DATA:  Productive cough for the last several months. Worsening weakness. Weight loss. EXAM: CT CHEST, ABDOMEN, AND PELVIS WITH CONTRAST TECHNIQUE: Multidetector CT imaging of the chest, abdomen and pelvis was performed following the standard protocol during bolus administration of intravenous contrast. CONTRAST:  1106mL ISOVUE-300 IOPAMIDOL (ISOVUE-300) INJECTION 61% COMPARISON:  Radiography same day. FINDINGS: CT CHEST FINDINGS Cardiovascular: Heart size is normal. No pericardial fluid. There is aortic atherosclerosis. There is aneurysmal dilatation of the ascending aorta with a maximal transverse diameter of 4.4 cm. There is extensive coronary artery calcification. Mediastinum/Nodes: There is right hilar lymphadenopathy measuring up to 3 cm. No mediastinal adenopathy. Esophagus is dilated and fluid-filled. There appears to be esophageal thickening distally. The possibility of esophageal carcinoma or stoppage I today is does exist. Lungs/Pleura: There is a partially necrotic peripheral right upper lobe  mass measuring up to 5 cm in diameter consistent with primary lung cancer. There is benign appearing pleural and parenchymal scarring at both lung apices the tumor has a broad surface along the pleura. At 1 point, 1 could question if there is some nodular tumor extension into the region of the parietal pleura. Patient does not have any pleural fluid however. Musculoskeletal: Ordinary degenerative changes affect the thoracic spine. No sign of metastatic disease in the thorax. CT ABDOMEN PELVIS FINDINGS Hepatobiliary: Fatty change of the liver. No focal liver lesion is seen. No calcified gallstones. Pancreas: Normal Spleen: Normal Adrenals/Urinary Tract: No adrenal mass. Kidneys are normal. Bladder is normal. Stomach/Bowel: Sigmoid diverticulosis without evidence of diverticulitis. No acute or significant bowel finding in the abdomen. Vascular/Lymphatic: Extensive atherosclerotic disease of the abdominal aorta and its branch vessels. Infrarenal abdominal aortic aneurysm with maximal transverse diameter of 2.8 cm. 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 Maximal diameter on the right is 1.7 cm. IVC is normal. No retroperitoneal adenopathy. Reproductive: Negative Other: No free fluid or air. Musculoskeletal: Lower lumbar degenerative  changes. No sign of metastatic disease in the lumbosacral spine or the pelvis. IMPRESSION: 5 cm partially necrotic mass in the posterior right upper lobe with a broad surface along the pleura. Question nodular pleural invasion. No pleural fluid however. Metastatic adenopathy in the right hilum measuring up to 3 cm. Thickening of the distal esophagus with dilated fluid filled esophagus proximal to that. This raises the possibility of a distal esophageal carcinoma. Findings could be seen with esophagitis. Aortic atherosclerosis. 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 Coronary artery calcification. Abdominal aortic atherosclerosis.  Infrarenal abdominal aortic aneurysm with maximal diameter of 2.8 cm. Electronically Signed   By: Nelson Chimes M.D.   On: 04/04/2018 14:47   Ct Abdomen Pelvis W Contrast  Result Date: 04/04/2018 CLINICAL DATA:  Productive cough for the last several months. Worsening weakness. Weight loss. EXAM: CT CHEST, ABDOMEN, AND PELVIS WITH CONTRAST TECHNIQUE: Multidetector CT imaging of the chest, abdomen and pelvis was performed following the standard protocol during bolus administration of intravenous contrast. CONTRAST:  136mL ISOVUE-300 IOPAMIDOL (ISOVUE-300) INJECTION 61% COMPARISON:  Radiography same day. FINDINGS: CT CHEST FINDINGS Cardiovascular: Heart size is normal. No pericardial fluid. There is aortic atherosclerosis. There is aneurysmal dilatation of the ascending aorta with a maximal transverse diameter of 4.4 cm. There is extensive coronary artery calcification. Mediastinum/Nodes: There is right hilar lymphadenopathy measuring up to 3 cm. No mediastinal adenopathy. Esophagus is dilated and fluid-filled. There appears to be esophageal thickening distally. The possibility of esophageal carcinoma or stoppage I today is does exist. Lungs/Pleura: There is a partially necrotic peripheral right upper lobe mass measuring up to 5 cm in diameter consistent with primary lung cancer. There is benign appearing pleural and parenchymal scarring at both lung apices the tumor has a broad surface along the pleura. At 1 point, 1 could question if there is some nodular tumor extension into the region of the parietal pleura. Patient does not have any pleural fluid however. Musculoskeletal: Ordinary degenerative changes affect the thoracic spine. No sign of metastatic disease in the thorax. CT ABDOMEN PELVIS FINDINGS Hepatobiliary: Fatty change of the liver. No focal liver lesion is seen. No calcified gallstones. Pancreas: Normal Spleen: Normal Adrenals/Urinary Tract: No adrenal mass. Kidneys are normal. Bladder is normal.  Stomach/Bowel: Sigmoid diverticulosis without evidence of diverticulitis. No acute or significant bowel finding in the abdomen. Vascular/Lymphatic: Extensive atherosclerotic disease of the abdominal aorta and its branch vessels. Infrarenal abdominal aortic aneurysm with maximal transverse diameter of 2.8 cm. 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 Maximal diameter on the right is 1.7 cm. IVC is normal. No retroperitoneal adenopathy. Reproductive: Negative Other: No free fluid or air. Musculoskeletal: Lower lumbar degenerative changes. No sign of metastatic disease in the lumbosacral spine or the pelvis. IMPRESSION: 5 cm partially necrotic mass in the posterior right upper lobe with a broad surface along the pleura. Question nodular pleural invasion. No pleural fluid however. Metastatic adenopathy in the right hilum measuring up to 3 cm. Thickening of the distal esophagus with dilated fluid filled esophagus proximal to that. This raises the possibility of a distal esophageal carcinoma. Findings could be seen with esophagitis. Aortic atherosclerosis. 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 Coronary artery calcification. Abdominal aortic atherosclerosis. Infrarenal abdominal aortic aneurysm with maximal diameter of 2.8 cm. Electronically Signed   By: Nelson Chimes M.D.   On: 04/04/2018 14:47    Procedures Procedures (including critical care time)  Medications Ordered in ED Medications  sodium chloride 0.9 % bolus 1,000 mL (0 mLs Intravenous Stopped 04/05/18 1615)     Initial Impression / Assessment and Plan / ED Course  I have reviewed the triage vital signs and the nursing notes.  Pertinent labs & imaging results that were available during my care of the patient were reviewed by me and considered in my medical decision making (see chart for details).     Patient has some dehydration on his chemistries.  Patient with a lung mass and persistent  vomiting he will be admitted to medicine for further work-up and treatment of vomiting  Final Clinical Impressions(s) / ED Diagnoses   Final diagnoses:  Dehydration    ED Discharge Orders    None       Milton Ferguson, MD 04/05/18 1907

## 2018-04-06 ENCOUNTER — Encounter (HOSPITAL_COMMUNITY): Payer: Self-pay | Admitting: Gastroenterology

## 2018-04-06 ENCOUNTER — Encounter (HOSPITAL_COMMUNITY)
Admission: EM | Disposition: A | Discharge status: Home / Self Care | Payer: Self-pay | Source: Home / Self Care | Attending: Family Medicine

## 2018-04-06 DIAGNOSIS — K311 Adult hypertrophic pyloric stenosis: Secondary | ICD-10-CM

## 2018-04-06 DIAGNOSIS — I712 Thoracic aortic aneurysm, without rupture: Secondary | ICD-10-CM

## 2018-04-06 DIAGNOSIS — K222 Esophageal obstruction: Principal | ICD-10-CM

## 2018-04-06 DIAGNOSIS — R634 Abnormal weight loss: Secondary | ICD-10-CM

## 2018-04-06 DIAGNOSIS — R1310 Dysphagia, unspecified: Secondary | ICD-10-CM

## 2018-04-06 DIAGNOSIS — T18128A Food in esophagus causing other injury, initial encounter: Secondary | ICD-10-CM

## 2018-04-06 DIAGNOSIS — R112 Nausea with vomiting, unspecified: Secondary | ICD-10-CM

## 2018-04-06 HISTORY — PX: ESOPHAGOGASTRODUODENOSCOPY: SHX5428

## 2018-04-06 LAB — CBC
HEMATOCRIT: 44.9 % (ref 39.0–52.0)
HEMOGLOBIN: 14 g/dL (ref 13.0–17.0)
MCH: 31.1 pg (ref 26.0–34.0)
MCHC: 31.2 g/dL (ref 30.0–36.0)
MCV: 99.8 fL (ref 78.0–100.0)
Platelets: 217 10*3/uL (ref 150–400)
RBC: 4.5 MIL/uL (ref 4.22–5.81)
RDW: 12.8 % (ref 11.5–15.5)
WBC: 8.9 10*3/uL (ref 4.0–10.5)

## 2018-04-06 LAB — COMPREHENSIVE METABOLIC PANEL
ALK PHOS: 76 U/L (ref 38–126)
ALT: 33 U/L (ref 17–63)
ANION GAP: 10 (ref 5–15)
AST: 32 U/L (ref 15–41)
Albumin: 3.5 g/dL (ref 3.5–5.0)
BUN: 30 mg/dL — ABNORMAL HIGH (ref 6–20)
CALCIUM: 9.7 mg/dL (ref 8.9–10.3)
CO2: 27 mmol/L (ref 22–32)
Chloride: 113 mmol/L — ABNORMAL HIGH (ref 101–111)
Creatinine, Ser: 0.86 mg/dL (ref 0.61–1.24)
GFR calc non Af Amer: >60 mL/min (ref >60)
Glucose, Bld: 105 mg/dL — ABNORMAL HIGH (ref 65–99)
POTASSIUM: 4 mmol/L (ref 3.5–5.1)
SODIUM: 150 mmol/L — AB (ref 135–145)
Total Bilirubin: 1.1 mg/dL (ref 0.3–1.2)
Total Protein: 6.5 g/dL (ref 6.5–8.1)

## 2018-04-06 SURGERY — EGD (ESOPHAGOGASTRODUODENOSCOPY)
Anesthesia: Moderate Sedation

## 2018-04-06 MED ORDER — ENSURE PO LIQD: 237.0000 mL | Freq: Four times a day (QID) | ORAL | Status: DC

## 2018-04-06 MED ORDER — POTASSIUM CHLORIDE 2 MEQ/ML IV SOLN: INTRAVENOUS | Status: DC

## 2018-04-06 MED ORDER — STERILE WATER FOR IRRIGATION IR SOLN
Status: DC | PRN
  Administered 2018-04-06: 15:00:00

## 2018-04-06 MED ORDER — MIDAZOLAM HCL 5 MG/5ML IJ SOLN
INTRAMUSCULAR | Status: DC | PRN
  Administered 2018-04-06: 1 mg via INTRAVENOUS
  Administered 2018-04-06: 2 mg via INTRAVENOUS
  Administered 2018-04-06: 1 mg via INTRAVENOUS

## 2018-04-06 MED ORDER — MEPERIDINE HCL 100 MG/ML IJ SOLN
INTRAMUSCULAR | Status: AC
  Filled 2018-04-06: qty 2

## 2018-04-06 MED ORDER — POTASSIUM CHLORIDE 2 MEQ/ML IV SOLN
INTRAVENOUS | Status: DC
  Administered 2018-04-06: 19:00:00 via INTRAVENOUS
  Filled 2018-04-06 (×3): qty 1000

## 2018-04-06 MED ORDER — LIDOCAINE VISCOUS HCL 2 % MT SOLN
OROMUCOSAL | Status: AC
  Filled 2018-04-06: qty 15

## 2018-04-06 MED ORDER — ENSURE ENLIVE PO LIQD: 237.0000 mL | Freq: Four times a day (QID) | ORAL | Status: DC

## 2018-04-06 MED ORDER — PRO-STAT SUGAR FREE PO LIQD: 30.0000 mL | Freq: Two times a day (BID) | ORAL | Status: DC

## 2018-04-06 MED ORDER — SODIUM CHLORIDE 0.9 % IV SOLN
INTRAVENOUS | Status: DC
  Administered 2018-04-06: 14:00:00 via INTRAVENOUS

## 2018-04-06 MED ORDER — MINERAL OIL PO OIL
TOPICAL_OIL | ORAL | Status: AC
  Filled 2018-04-06: qty 30

## 2018-04-06 MED ORDER — HYDRALAZINE HCL 20 MG/ML IJ SOLN
INTRAMUSCULAR | Status: DC | PRN
  Administered 2018-04-06: 10 mg via INTRAVENOUS

## 2018-04-06 MED ORDER — CEFAZOLIN SODIUM-DEXTROSE 1-4 GM/50ML-% IV SOLN
INTRAVENOUS | Status: AC
  Filled 2018-04-06: qty 50

## 2018-04-06 MED ORDER — MEPERIDINE HCL 100 MG/ML IJ SOLN
INTRAMUSCULAR | Status: DC | PRN
  Administered 2018-04-06 (×2): 25 mg

## 2018-04-06 MED ORDER — PRO-STAT SUGAR FREE PO LIQD
30.0000 mL | Freq: Two times a day (BID) | ORAL | Status: DC
  Administered 2018-04-06 – 2018-04-08 (×3): 30 mL via ORAL
  Filled 2018-04-06 (×4): qty 30

## 2018-04-06 MED ORDER — LIDOCAINE VISCOUS HCL 2 % MT SOLN
OROMUCOSAL | Status: DC | PRN
  Administered 2018-04-06: 1 via OROMUCOSAL

## 2018-04-06 MED ORDER — PNEUMOCOCCAL VAC POLYVALENT 25 MCG/0.5ML IJ INJ
0.5000 mL | INJECTION | INTRAMUSCULAR | Status: AC
  Administered 2018-04-07: 0.5 mL via INTRAMUSCULAR
  Filled 2018-04-06: qty 0.5

## 2018-04-06 MED ORDER — HYDRALAZINE HCL 20 MG/ML IJ SOLN
INTRAMUSCULAR | Status: AC
  Filled 2018-04-06: qty 1

## 2018-04-06 MED ORDER — MIDAZOLAM HCL 5 MG/5ML IJ SOLN
INTRAMUSCULAR | Status: AC
  Filled 2018-04-06: qty 10

## 2018-04-06 NOTE — Progress Notes (Signed)
Initial Nutrition Assessment  DOCUMENTATION CODES:   Severe malnutrition in context of chronic illness, Underweight  INTERVENTION:   Education: high protein/high calorie  Follow results of EGD for potential diet advancement.   NUTRITION DIAGNOSIS:   Severe Malnutrition related to vomiting, decreased appetite, catabolic illness, inability to eat(pt's appetite "disappeared" and cannot keep solid food down.  Suspected cancer, waiting on tests.) as evidenced by percent weight loss, energy intake < 75% for > or equal to 1 month, severe fat depletion, severe muscle depletion, per patient/family report.  GOAL:   Patient will meet greater than or equal to 90% of their needs, Weight gain  MONITOR:   PO intake, Supplement acceptance, Weight trends, Diet advancement  REASON FOR ASSESSMENT:   Malnutrition Screening Tool   ASSESSMENT:   67 y/o male with Hx of GERD, HOH, and HTN.  Admitted for:  N/V, cough, poor appetite, wt loss.  MST score of 5.  Pt has had poor to no appetite over the past 2 months.  Said his appetite "just stopped" and he started involuntarily regurgitating most solid food he ate.  Pt said diet over the last 6 wks has consisted of: ensure, peanut butter crackers, sweetened milk with ovaltine, chocolate ice cream, water, and gatorade (which he said tasted salty to him).  Pt also takes multivitamin: centrum for 50+.  Pt is still fairly active: does a lot of walking throughout the day.  Pt and wife do chores around the house and like to take day trips to thrift stores.    Pt said UBW was 153 lbs.  He is now 97 lbs.  Pt said this 56 lbs loss has happened over the past 2 months.  This is a 36.6% loss in wt which is extremely clinically significant for severe malnutrition.  The NFPE also showed severe muscle and fat wasting in all areas.    Currently pt is NPO for EGD.  Nutrition interventions will depend on a variety of factors including the results of EGD, if an esophageal  dilation is deemed appropriate or not, and how patient handles foods PO.  Education for high protein/high calorie is appropriate.  Labs reviewed: Sodium 150, Chloride 113, BUN 30, Glucose 105 Recent Labs  Lab 04/04/18 1245 04/05/18 1513 04/06/18 0531  NA 145 148* 150*  K 4.3 4.4 4.0  CL 104 107 113*  CO2 28 26 27   BUN 26* 33* 30*  CREATININE 1.05 1.14 0.86  CALCIUM 10.4* 10.4* 9.7  GLUCOSE 115* 106* 105*   Medications: Nicotine, saline,    Past Medical History:  Diagnosis Date  . GERD (gastroesophageal reflux disease)    denies chronic history  . HOH (hard of hearing)   . Hypertension    NUTRITION - FOCUSED PHYSICAL EXAM:    Most Recent Value  Orbital Region  Severe depletion  Upper Arm Region  Severe depletion  Thoracic and Lumbar Region  Severe depletion  Buccal Region  Severe depletion  Temple Region  Severe depletion  Clavicle Bone Region  Severe depletion  Clavicle and Acromion Bone Region  Severe depletion  Scapular Bone Region  Severe depletion  Dorsal Hand  Severe depletion  Patellar Region  Severe depletion  Anterior Thigh Region  Severe depletion  Posterior Calf Region  Severe depletion  Edema (RD Assessment)  None  Skin  Reviewed  Nails  Reviewed     Diet Order:   Diet Order           Diet NPO time specified Except  for: Sips with Meds  Diet effective midnight         EDUCATION NEEDS:   No education needs have been identified at this time  Skin:  Skin Assessment: Skin Integrity Issues: Skin Integrity Issues:: Other (Comment) Other: Ecchymosis: arm/leg.  Excoriation: lower back.  Last BM:  6/1  Height:   Ht Readings from Last 1 Encounters:  04/05/18 5\' 7"  (1.702 m)   Weight:   Wt Readings from Last 1 Encounters:  04/05/18 97 lb (44 kg)   Ideal Body Weight:  67.1 kg  BMI:  Body mass index is 15.19 kg/m.  Estimated Nutritional Needs:   Kcal:  2000-2350 kcal (30-35 kcal/kg IBW)  Protein:  101-134 g (1.5-2 g/kg IBW)  Fluid:   >/= 2000 mL (30 mL/kg IBW)

## 2018-04-06 NOTE — Consult Note (Signed)
Referring Provider: Dr. Heath Lark  Primary Care Physician:  Patient, No Pcp Per Primary Gastroenterologist:  Dr. Oneida Alar   Date of Admission: 04/05/18 Date of Consultation: 04/06/18  Reason for Consultation:  Dysphagia, N/V, concern for esophageal mass   HPI:  Joshua Schwartz is a 67 y.o. year old male who has had a 2 month history of weight loss, cough, poor appetite, presenting to the  ED 6/2. CT chest with contrast: 5 cm partially necrotic mass in posterior right upper lobe, metastatic adenopathy in the right hilum, thickening of distal esophagus with dilated fluid filled esophagus, raising concern for esophageal carcinoma. CT abd/pelvis with contrast also completed. Additional findings as noted below. ED physician had discussed with Oncology, who was going to contact patient for appt. Plans were for CT guided biopsy with IR as outpatient. He was discharged but returned again yesterday unable to tolerate oral intake.   Patient states he has been living off of boost, sweetened milk, liquids. Notes vague epigastric pain. Has had considerable weight loss over past 2 months, normally weighing 153 per his report a few months ago. Today is 97. Tried to eat yogurt yesterday evening but regurgitated this. Only able to tolerate liquids. Spitting up phlegm. Denies any chronic GERD symptoms. Has taken Excedrin and Ibuprofen daily to BID for the last 5-6 years due to carpal tunnel issues. He has not seen a PCP in over 25 years. Quit smoking cigarettes 3 years ago but has continued to vape intermittently. 45 years smoking history. Denies alcohol abuse. No lower GI symptoms. Feels fatigued. No prior colonoscopy or endoscopy. No family history of any GI cancers.   Past Medical History:  Diagnosis Date  . GERD (gastroesophageal reflux disease)    denies chronic history  . HOH (hard of hearing)   . Hypertension     Past Surgical History:  Procedure Laterality Date  . EYE SURGERY     as a child: artifical  tear ducts    Prior to Admission medications   Medication Sig Start Date End Date Taking? Authorizing Provider  Aspirin-Acetaminophen-Caffeine (EXCEDRIN PO) Take 1 tablet by mouth every 6 (six) hours.   Yes [provider]  ibuprofen (ADVIL,MOTRIN) 200 MG tablet Take 200 mg by mouth every 6 (six) hours as needed.   Yes [provider]  omeprazole (PRILOSEC) 20 MG capsule Take 1 capsule (20 mg total) by mouth 2 (two) times daily. 04/04/18  Yes Tanna Furry, MD    Current Facility-Administered Medications  Medication Dose Route Frequency Provider Last Rate Last Dose  . acetaminophen (TYLENOL) tablet 650 mg  650 mg Oral Q6H PRN Manuella Ghazi, Pratik D, DO       Or  . acetaminophen (TYLENOL) suppository 650 mg  650 mg Rectal Q6H PRN Manuella Ghazi, Pratik D, DO      . famotidine (PEPCID) IVPB 20 mg premix  20 mg Intravenous Q12H Shah, Pratik D, DO   Stopped at 04/06/18 0015  . hydrALAZINE (APRESOLINE) injection 10 mg  10 mg Intravenous Q4H PRN Heath Lark D, DO   10 mg at 04/05/18 2103  . nicotine (NICODERM CQ - dosed in mg/24 hr) patch 7 mg  7 mg Transdermal Daily Manuella Ghazi, Pratik D, DO   7 mg at 04/05/18 2343  . ondansetron (ZOFRAN) tablet 4 mg  4 mg Oral Q6H PRN Manuella Ghazi, Pratik D, DO       Or  . ondansetron (ZOFRAN) injection 4 mg  4 mg Intravenous Q6H PRN Heath Lark D,  DO   4 mg at 04/05/18 2344  . [START ON 04/07/2018] pneumococcal 23 valent vaccine (PNU-IMMUNE) injection 0.5 mL  0.5 mL Intramuscular Tomorrow-1000 Shah, Pratik D, DO        Allergies as of 04/05/2018 - Review Complete 04/05/2018  Allergen Reaction Noted  . Codeine  04/04/2018    Family History  Problem Relation Age of Onset  . Colon cancer Neg Hx   . Colon polyps Neg Hx     Social History   Socioeconomic History  . Marital status: Single    Spouse name: Not on file  . Number of children: Not on file  . Years of education: Not on file  . Highest education level: Not on file  Occupational History  . Not on file   Social Needs  . Financial resource strain: Not on file  . Food insecurity:    Worry: Not on file    Inability: Not on file  . Transportation needs:    Medical: Not on file    Non-medical: Not on file  Tobacco Use  . Smoking status: Former Smoker    Years: 45.00    Last attempt to quit: 09/04/2015    Years since quitting: 2.5  . Smokeless tobacco: Never Used  . Tobacco comment: contiues to vape intermittently   Substance and Sexual Activity  . Alcohol use: Not Currently    Frequency: Never  . Drug use: Never  . Sexual activity: Not on file  Lifestyle  . Physical activity:    Days per week: Not on file    Minutes per session: Not on file  . Stress: Not on file  Relationships  . Social connections:    Talks on phone: Not on file    Gets together: Not on file    Attends religious service: Not on file    Active member of club or organization: Not on file    Attends meetings of clubs or organizations: Not on file    Relationship status: Not on file  . Intimate partner violence:    Fear of current or ex partner: Not on file    Emotionally abused: Not on file    Physically abused: Not on file    Forced sexual activity: Not on file  Other Topics Concern  . Not on file  Social History Narrative   One son, has not seen in some time. Has a girlfriend who lives in his apartment complex. Lives alone.     Review of Systems: Gen:see HPI  CV: Denies chest pain, heart palpitations, syncope, edema  Resp: Denies shortness of breath with rest, cough, wheezing GI: see HPI  GU : Denies urinary burning, urinary frequency, urinary incontinence.  MS: see HPI  Derm: Denies rash, itching, dry skin Psych: Denies depression, anxiety,confusion, or memory loss Heme: see HPI   Physical Exam: Vital signs in last 24 hours: Temp:  [97.8 F (36.6 C)-98.4 F (36.9 C)] 97.8 F (36.6 C) (06/04 0713) Pulse Rate:  [63-91] 83 (06/04 0713) Resp:  [14-18] 16 (06/04 0123) BP: (112-202)/(82-124)  141/98 (06/04 0713) SpO2:  [92 %-100 %] 100 % (06/04 0713) Weight:  [97 lb (44 kg)-99 lb 14.4 oz (45.3 kg)] 97 lb (44 kg) (06/03 2136) Last BM Date: 04/03/18 General:   Alert, pleasant, cachectic-appearing Head:  Normocephalic and atraumatic. Eyes:  Sclera clear, no icterus.    Ears:  HOH . Nose:  No deformity, discharge,  or lesions. Mouth:  Poor dentition  Lungs:  Clear throughout  to auscultation.  Heart:  S1 S2 present with systolic murmur  Abdomen:  Soft, no TTP, thin with protruding ribs and hip bones Rectal:  Deferred  Extremities:  Markedly thin, muscle wasting bilateral upper and lower extremities  Neurologic:  Alert and  oriented x4 Psych:  Alert and cooperative. Normal mood and affect.  Intake/Output from previous day: 06/03 0701 - 06/04 0700 In: 1547.5 [P.O.:240; I.V.:257.5; IV Piggyback:1050] Out: -  Intake/Output this shift: No intake/output data recorded.  Lab Results: Recent Labs    04/04/18 1245 04/05/18 1513 04/06/18 0531  WBC 9.2 10.1 8.9  HGB 16.7 15.7 14.0  HCT 50.7 48.4 44.9  PLT 228 233 217   BMET Recent Labs    04/04/18 1245 04/05/18 1513 04/06/18 0531  NA 145 148* 150*  K 4.3 4.4 4.0  CL 104 107 113*  CO2 28 26 27   GLUCOSE 115* 106* 105*  BUN 26* 33* 30*  CREATININE 1.05 1.14 0.86  CALCIUM 10.4* 10.4* 9.7   LFT Recent Labs    04/04/18 1245 04/05/18 1513 04/06/18 0531  PROT 7.7 7.4 6.5  ALBUMIN 4.0 4.1 3.5  AST 19 28 32  ALT 23 28 33  ALKPHOS 84 87 76  BILITOT 1.3* 1.2 1.1    Studies/Results: Dg Chest 2 View  Result Date: 04/04/2018 CLINICAL DATA:  Cough, weight loss. EXAM: CHEST - 2 VIEW COMPARISON:  None. FINDINGS: 6 cm mass within the RIGHT upper lobe. LEFT lung is clear. Prominence of the RIGHT hilum could be due to associated lymphadenopathy or ectasia of the ascending thoracic aorta. Heart size is normal. No acute or suspicious osseous finding. IMPRESSION: RIGHT upper lobe mass measuring approximately 6 cm greatest  dimension. This is almost certainly a lung cancer. Recommend chest CT for further characterization and to exclude associated mediastinal and/or perihilar lymphadenopathy. Electronically Signed   By: Franki Cabot M.D.   On: 04/04/2018 13:25   Ct Chest W Contrast  Result Date: 04/04/2018 CLINICAL DATA:  Productive cough for the last several months. Worsening weakness. Weight loss. EXAM: CT CHEST, ABDOMEN, AND PELVIS WITH CONTRAST TECHNIQUE: Multidetector CT imaging of the chest, abdomen and pelvis was performed following the standard protocol during bolus administration of intravenous contrast. CONTRAST:  164mL ISOVUE-300 IOPAMIDOL (ISOVUE-300) INJECTION 61% COMPARISON:  Radiography same day. FINDINGS: CT CHEST FINDINGS Cardiovascular: Heart size is normal. No pericardial fluid. There is aortic atherosclerosis. There is aneurysmal dilatation of the ascending aorta with a maximal transverse diameter of 4.4 cm. There is extensive coronary artery calcification. Mediastinum/Nodes: There is right hilar lymphadenopathy measuring up to 3 cm. No mediastinal adenopathy. Esophagus is dilated and fluid-filled. There appears to be esophageal thickening distally. The possibility of esophageal carcinoma or stoppage I today is does exist. Lungs/Pleura: There is a partially necrotic peripheral right upper lobe mass measuring up to 5 cm in diameter consistent with primary lung cancer. There is benign appearing pleural and parenchymal scarring at both lung apices the tumor has a broad surface along the pleura. At 1 point, 1 could question if there is some nodular tumor extension into the region of the parietal pleura. Patient does not have any pleural fluid however. Musculoskeletal: Ordinary degenerative changes affect the thoracic spine. No sign of metastatic disease in the thorax. CT ABDOMEN PELVIS FINDINGS Hepatobiliary: Fatty change of the liver. No focal liver lesion is seen. No calcified gallstones. Pancreas: Normal Spleen:  Normal Adrenals/Urinary Tract: No adrenal mass. Kidneys are normal. Bladder is normal. Stomach/Bowel: Sigmoid diverticulosis without evidence of  diverticulitis. No acute or significant bowel finding in the abdomen. Vascular/Lymphatic: Extensive atherosclerotic disease of the abdominal aorta and its branch vessels. Infrarenal abdominal aortic aneurysm with maximal transverse diameter of 2.8 cm. 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 Maximal diameter on the right is 1.7 cm. IVC is normal. No retroperitoneal adenopathy. Reproductive: Negative Other: No free fluid or air. Musculoskeletal: Lower lumbar degenerative changes. No sign of metastatic disease in the lumbosacral spine or the pelvis. IMPRESSION: 5 cm partially necrotic mass in the posterior right upper lobe with a broad surface along the pleura. Question nodular pleural invasion. No pleural fluid however. Metastatic adenopathy in the right hilum measuring up to 3 cm. Thickening of the distal esophagus with dilated fluid filled esophagus proximal to that. This raises the possibility of a distal esophageal carcinoma. Findings could be seen with esophagitis. Aortic atherosclerosis. 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 Coronary artery calcification. Abdominal aortic atherosclerosis. Infrarenal abdominal aortic aneurysm with maximal diameter of 2.8 cm. Electronically Signed   By: Nelson Chimes M.D.   On: 04/04/2018 14:47   Ct Abdomen Pelvis W Contrast  Result Date: 04/04/2018 CLINICAL DATA:  Productive cough for the last several months. Worsening weakness. Weight loss. EXAM: CT CHEST, ABDOMEN, AND PELVIS WITH CONTRAST TECHNIQUE: Multidetector CT imaging of the chest, abdomen and pelvis was performed following the standard protocol during bolus administration of intravenous contrast. CONTRAST:  182mL ISOVUE-300 IOPAMIDOL (ISOVUE-300) INJECTION 61% COMPARISON:  Radiography same day. FINDINGS: CT CHEST  FINDINGS Cardiovascular: Heart size is normal. No pericardial fluid. There is aortic atherosclerosis. There is aneurysmal dilatation of the ascending aorta with a maximal transverse diameter of 4.4 cm. There is extensive coronary artery calcification. Mediastinum/Nodes: There is right hilar lymphadenopathy measuring up to 3 cm. No mediastinal adenopathy. Esophagus is dilated and fluid-filled. There appears to be esophageal thickening distally. The possibility of esophageal carcinoma or stoppage I today is does exist. Lungs/Pleura: There is a partially necrotic peripheral right upper lobe mass measuring up to 5 cm in diameter consistent with primary lung cancer. There is benign appearing pleural and parenchymal scarring at both lung apices the tumor has a broad surface along the pleura. At 1 point, 1 could question if there is some nodular tumor extension into the region of the parietal pleura. Patient does not have any pleural fluid however. Musculoskeletal: Ordinary degenerative changes affect the thoracic spine. No sign of metastatic disease in the thorax. CT ABDOMEN PELVIS FINDINGS Hepatobiliary: Fatty change of the liver. No focal liver lesion is seen. No calcified gallstones. Pancreas: Normal Spleen: Normal Adrenals/Urinary Tract: No adrenal mass. Kidneys are normal. Bladder is normal. Stomach/Bowel: Sigmoid diverticulosis without evidence of diverticulitis. No acute or significant bowel finding in the abdomen. Vascular/Lymphatic: Extensive atherosclerotic disease of the abdominal aorta and its branch vessels. Infrarenal abdominal aortic aneurysm with maximal transverse diameter of 2.8 cm. 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 Maximal diameter on the right is 1.7 cm. IVC is normal. No retroperitoneal adenopathy. Reproductive: Negative Other: No free fluid or air. Musculoskeletal: Lower lumbar degenerative changes. No sign of metastatic disease in the lumbosacral  spine or the pelvis. IMPRESSION: 5 cm partially necrotic mass in the posterior right upper lobe with a broad surface along the pleura. Question nodular pleural invasion. No pleural fluid however. Metastatic adenopathy in the right hilum measuring up to 3 cm. Thickening of the distal esophagus with dilated fluid filled esophagus proximal to that. This raises  the possibility of a distal esophageal carcinoma. Findings could be seen with esophagitis. Aortic atherosclerosis. 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 Coronary artery calcification. Abdominal aortic atherosclerosis. Infrarenal abdominal aortic aneurysm with maximal diameter of 2.8 cm. Electronically Signed   By: Nelson Chimes M.D.   On: 04/04/2018 14:47    Impression: 67 year old male presenting with massive weight loss, decreased oral intake, dysphagia, inability to tolerate solids, and CT chest/abd/pelvis with findings of necrotic mass in posterior right upper lobe, thickening of distal esophagus with dilated fluid filled esophagus, concerning for carcinoma. Will pursue EGD today with biopsies. Discussed risks and benefits with patient, who stated understanding. He has been NPO since midnight.   Plan: Remain NPO EGD with Dr. Oneida Alar today. Discussed risks and benefits including dilation if appropriate, although concerning for mass as culprit for symptoms. Stated understanding   Annitta Needs, PhD, ANP-BC [facility]     LOS: 1 day    04/06/2018, 9:21 AM

## 2018-04-06 NOTE — Op Note (Addendum)
[facility] Patient Name: Joshua Schwartz Procedure Date: 04/06/2018 1:57 PM MRN: [id number] Date of Birth: [birth year] Attending MD: Barney Drain MD, MD CSN: [id number] Age: 67 Admit Type: Outpatient Procedure:                Upper GI endoscopy WITH ESOPHAGEAL/BALLOON DILATION                            & COLD FORCEPS BIOPSY Indications:              Dysphagia, Weight loss Providers:                Barney Drain MD, MD, Janeece Riggers, RN, Aram Candela Referring MD:             NONE Medicines:                Meperidine 50 mg IV, Midazolam 4 mg IV Complications:            No immediate complications. Estimated Blood Loss:     Estimated blood loss was minimal. Procedure:                Pre-Anesthesia Assessment:                           - Prior to the procedure, a History and Physical                            was performed, and patient medications and                            allergies were reviewed. The patient's tolerance of                            previous anesthesia was also reviewed. The risks                            and benefits of the procedure and the sedation                            options and risks were discussed with the patient.                            All questions were answered, and informed consent                            was obtained. Prior Anticoagulants: The patient has                            taken no previous anticoagulant or antiplatelet                            agents. ASA Grade Assessment: II - A patient with                            mild systemic disease. After reviewing the risks  and benefits, the patient was deemed in                            satisfactory condition to undergo the procedure.                            After obtaining informed consent, the endoscope was                            passed under direct vision. Throughout the                            procedure, the patient's blood pressure,  pulse, and                            oxygen saturations were monitored continuously. The                            EG-299OI ([id number]) scope was introduced through the                            mouth, and advanced to the second part of duodenum.                            The EG-[id number] ([id number]) scope was introduced through                            the mouth, and advanced to the pylorus. The                            EG-299OI ([id number]) scope was introduced through the                            and advanced to the. The upper GI endoscopy was                            technically difficult and complex due to presence                            of food and a partially obstructing mass.                            Successful completion of the procedure was aided by                            increasing the dose of sedation medication and                            withdrawing the scope and replacing with the                            pediatric endoscope. The patient tolerated the  procedure well. Scope In: 2:43:51 PM Scope Out: 3:16:14 PM Total Procedure Duration: 0 hours 32 minutes 23 seconds  Findings:      One malignant-appearing, intrinsic severe (stenosis; an endoscope cannot       pass) stenosis was found 40 to 45 cm from the incisors. This stenosis       measured 9 mm (inner diameter) x 5 cm (in length). The stenosis was       traversed after downsizing ot pediatric gastroscope. A guidewire was       placed and the scope was withdrawn. Dilation was performed with a Savary       dilator with mild resistance at 9 mm, 10 mm, 12 mm and 12.8 mm.       Estimated blood loss was minimal. PASSED ADULT GASTROSCOPE TO 40 CM FROM       THE INCISORS. A TTS dilator was passed through the scope. Dilation with       a 12 mm pyloric balloon dilator was performed. The dilation site was       examined and showed no change. Estimated blood loss was minimal. UNABLE       TO  PASS GASTROSCOPE PAST ESOPHAGEAL STRICTURE(40-45 CM FROM THE       INCISORS).      Segmental moderate mucosal changes characterized by erythema, FIRMNESS,       and nodularity were found in the distal esophagus. This was biopsied       with a cold forceps for histology.      Food was found in the distal esophagus. Removal of food was accomplished       via RETRIEVAL NET.      Diffuse mild inflammation characterized by congestion (edema) and       erythema was found in the entire examined stomach. NO BIOPSIES OBTAINED.      A benign-appearing, intrinsic moderate stenosis was found at the       pylorus. This was non-traversed. UNABLE TO DILATE DUE TO ONLY ABLE TO       ACCESS DISTAL STOMACH WITH PEDIATRIC GASTROSCOPE. 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 Dilated/BIOPSIED.                           - Food in the distal esophagus. Removal was                            successful.                           - MODERATE Gastritis.                           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                           - UNABLE TO PLACE FEEDING TUBE VIA  EGD DUE TO                            ESOPHAGEAL OBSTRUCTION Moderate Sedation:      Moderate (conscious) sedation was administered by the endoscopy nurse       and supervised by the endoscopist. The following parameters were       monitored: oxygen saturation, heart rate, blood pressure, and response       to care. Total physician intraservice time was 61 minutes. Recommendation:           - Return patient to hospital ward for ongoing care.                           - LIQUIDS ONLY diet. NO SOLID FOOD. ENSURE                            QID.PROSTAT BID. ADD LR W/ 20 mEq KCL 50 CC/HR.                           - Continue present medications. PEPCID BID.                           - Await pathology  results. MAY NEED CT BIOPSY OF                            LUNG MASS.                           - Refer to an oncologist.                           - Refer to a gastroenterologist TO CONSIDER [place]. Procedure Code(s):        --- Professional ---                           [phone], Esophagogastroduodenoscopy, flexible,                            transoral; with removal of foreign body(s)                           43248, Esophagogastroduodenoscopy, flexible,                            transoral; with insertion of guide wire followed by                            passage of dilator(s) through esophagus over guide                            wire  [phone], Esophagogastroduodenoscopy, flexible,                            transoral; with transendoscopic balloon dilation of                            esophagus (less than 30 mm diameter)                           43239, Esophagogastroduodenoscopy, flexible,                            transoral; with biopsy, single or multiple                           [id number], Moderate sedation services provided by the                            same physician or other qualified health care                            professional performing a gastrointestinal                            endoscopic service that sedation supports,                            requiring the presence of an independent trained                            observer to assist in the monitoring of the                            patient's level of consciousness and physiological                            status; initial 15 minutes of intra-service time;                            patient age 26 years or older (additional time may                            be reported with [phone], as appropriate)                           [phone], Moderate sedation services provided by the                            same physician  or other qualified health care                            professional performing the diagnostic or                            therapeutic service that the sedation supports,  requiring the presence of an independent trained                            observer to assist in the monitoring of the                            patient's level of consciousness and physiological                            status; each additional 15 minutes intraservice                            time (List separately in addition to code for                            primary service)                           [phone], Moderate sedation services provided by the                            same physician or other qualified health care                            professional performing the diagnostic or                            therapeutic service that the sedation supports,                            requiring the presence of an independent trained                            observer to assist in the monitoring of the                            patient's level of consciousness and physiological                            status; each additional 15 minutes intraservice                            time (List separately in addition to code for                            primary service)                           [phone], Moderate sedation services provided by the                            same physician or other qualified health care                            professional performing the  diagnostic or                            therapeutic service that the sedation supports,                            requiring the presence of an independent trained                            observer to assist in the monitoring of the                            patient's level of consciousness and physiological                            status; each additional 15 minutes intraservice                             time (List separately in addition to code for                            primary service) Diagnosis Code(s):        --- Professional ---                           R13.10, Dysphagia, unspecified                           R63.4, Abnormal weight loss CPT copyright 2017 [employer]. All rights reserved. The codes documented in this report are preliminary and upon coder review may  be revised to meet current compliance requirements. Barney Drain, MD Barney Drain MD, MD 04/06/2018 3:49:41 PM This report has been signed electronically. Number of Addenda: 0

## 2018-04-06 NOTE — Interval H&P Note (Signed)
History and Physical Interval Note:  04/06/2018 2:16 PM  Joshua Schwartz  has presented today for surgery, with the diagnosis of distal esophageal wall thickening, concern for esophageal mass, dysphagia, weight loss  The various methods of treatment have been discussed with the patient and family. After consideration of risks, benefits and other options for treatment, the patient has consented to  Procedure(s) with comments: ESOPHAGOGASTRODUODENOSCOPY (EGD) (N/A) - possible dilation  as a surgical intervention .  The patient's history has been reviewed, patient examined, no change in status, stable for surgery.  I have reviewed the patient's chart and labs.  Questions were answered to the patient's satisfaction.     [employer]

## 2018-04-06 NOTE — H&P (View-Only) (Signed)
Referring Provider: Dr. Heath Lark  Primary Care Physician:  Patient, No Pcp Per Primary Gastroenterologist:  Dr. Oneida Alar   Date of Admission: 04/05/18 Date of Consultation: 04/06/18  Reason for Consultation:  Dysphagia, N/V, concern for esophageal mass   HPI:  Joshua Schwartz is a 67 y.o. year old male who has had a 2 month history of weight loss, cough, poor appetite, presenting to the  ED 6/2. CT chest with contrast: 5 cm partially necrotic mass in posterior right upper lobe, metastatic adenopathy in the right hilum, thickening of distal esophagus with dilated fluid filled esophagus, raising concern for esophageal carcinoma. CT abd/pelvis with contrast also completed. Additional findings as noted below. ED physician had discussed with Oncology, who was going to contact patient for appt. Plans were for CT guided biopsy with IR as outpatient. He was discharged but returned again yesterday unable to tolerate oral intake.   Patient states he has been living off of boost, sweetened milk, liquids. Notes vague epigastric pain. Has had considerable weight loss over past 2 months, normally weighing 153 per his report a few months ago. Today is 97. Tried to eat yogurt yesterday evening but regurgitated this. Only able to tolerate liquids. Spitting up phlegm. Denies any chronic GERD symptoms. Has taken Excedrin and Ibuprofen daily to BID for the last 5-6 years due to carpal tunnel issues. He has not seen a PCP in over 25 years. Quit smoking cigarettes 3 years ago but has continued to vape intermittently. 45 years smoking history. Denies alcohol abuse. No lower GI symptoms. Feels fatigued. No prior colonoscopy or endoscopy. No family history of any GI cancers.   Past Medical History:  Diagnosis Date  . GERD (gastroesophageal reflux disease)    denies chronic history  . HOH (hard of hearing)   . Hypertension     Past Surgical History:  Procedure Laterality Date  . EYE SURGERY     as a child: artifical  tear ducts    Prior to Admission medications   Medication Sig Start Date End Date Taking? Authorizing Provider  Aspirin-Acetaminophen-Caffeine (EXCEDRIN PO) Take 1 tablet by mouth every 6 (six) hours.   Yes [provider]  ibuprofen (ADVIL,MOTRIN) 200 MG tablet Take 200 mg by mouth every 6 (six) hours as needed.   Yes [provider]  omeprazole (PRILOSEC) 20 MG capsule Take 1 capsule (20 mg total) by mouth 2 (two) times daily. 04/04/18  Yes Tanna Furry, MD    Current Facility-Administered Medications  Medication Dose Route Frequency Provider Last Rate Last Dose  . acetaminophen (TYLENOL) tablet 650 mg  650 mg Oral Q6H PRN Manuella Ghazi, Pratik D, DO       Or  . acetaminophen (TYLENOL) suppository 650 mg  650 mg Rectal Q6H PRN Manuella Ghazi, Pratik D, DO      . famotidine (PEPCID) IVPB 20 mg premix  20 mg Intravenous Q12H Shah, Pratik D, DO   Stopped at 04/06/18 0015  . hydrALAZINE (APRESOLINE) injection 10 mg  10 mg Intravenous Q4H PRN Heath Lark D, DO   10 mg at 04/05/18 2103  . nicotine (NICODERM CQ - dosed in mg/24 hr) patch 7 mg  7 mg Transdermal Daily Manuella Ghazi, Pratik D, DO   7 mg at 04/05/18 2343  . ondansetron (ZOFRAN) tablet 4 mg  4 mg Oral Q6H PRN Manuella Ghazi, Pratik D, DO       Or  . ondansetron (ZOFRAN) injection 4 mg  4 mg Intravenous Q6H PRN Heath Lark D,  DO   4 mg at 04/05/18 2344  . [START ON 04/07/2018] pneumococcal 23 valent vaccine (PNU-IMMUNE) injection 0.5 mL  0.5 mL Intramuscular Tomorrow-1000 Shah, Pratik D, DO        Allergies as of 04/05/2018 - Review Complete 04/05/2018  Allergen Reaction Noted  . Codeine  04/04/2018    Family History  Problem Relation Age of Onset  . Colon cancer Neg Hx   . Colon polyps Neg Hx     Social History   Socioeconomic History  . Marital status: Single    Spouse name: Not on file  . Number of children: Not on file  . Years of education: Not on file  . Highest education level: Not on file  Occupational History  . Not on file   Social Needs  . Financial resource strain: Not on file  . Food insecurity:    Worry: Not on file    Inability: Not on file  . Transportation needs:    Medical: Not on file    Non-medical: Not on file  Tobacco Use  . Smoking status: Former Smoker    Years: 45.00    Last attempt to quit: 09/04/2015    Years since quitting: 2.5  . Smokeless tobacco: Never Used  . Tobacco comment: contiues to vape intermittently   Substance and Sexual Activity  . Alcohol use: Not Currently    Frequency: Never  . Drug use: Never  . Sexual activity: Not on file  Lifestyle  . Physical activity:    Days per week: Not on file    Minutes per session: Not on file  . Stress: Not on file  Relationships  . Social connections:    Talks on phone: Not on file    Gets together: Not on file    Attends religious service: Not on file    Active member of club or organization: Not on file    Attends meetings of clubs or organizations: Not on file    Relationship status: Not on file  . Intimate partner violence:    Fear of current or ex partner: Not on file    Emotionally abused: Not on file    Physically abused: Not on file    Forced sexual activity: Not on file  Other Topics Concern  . Not on file  Social History Narrative   One son, has not seen in some time. Has a girlfriend who lives in his apartment complex. Lives alone.     Review of Systems: Gen:see HPI  CV: Denies chest pain, heart palpitations, syncope, edema  Resp: Denies shortness of breath with rest, cough, wheezing GI: see HPI  GU : Denies urinary burning, urinary frequency, urinary incontinence.  MS: see HPI  Derm: Denies rash, itching, dry skin Psych: Denies depression, anxiety,confusion, or memory loss Heme: see HPI   Physical Exam: Vital signs in last 24 hours: Temp:  [97.8 F (36.6 C)-98.4 F (36.9 C)] 97.8 F (36.6 C) (06/04 0713) Pulse Rate:  [63-91] 83 (06/04 0713) Resp:  [14-18] 16 (06/04 0123) BP: (112-202)/(82-124)  141/98 (06/04 0713) SpO2:  [92 %-100 %] 100 % (06/04 0713) Weight:  [97 lb (44 kg)-99 lb 14.4 oz (45.3 kg)] 97 lb (44 kg) (06/03 2136) Last BM Date: 04/03/18 General:   Alert, pleasant, cachectic-appearing Head:  Normocephalic and atraumatic. Eyes:  Sclera clear, no icterus.    Ears:  HOH . Nose:  No deformity, discharge,  or lesions. Mouth:  Poor dentition  Lungs:  Clear throughout  to auscultation.  Heart:  S1 S2 present with systolic murmur  Abdomen:  Soft, no TTP, thin with protruding ribs and hip bones Rectal:  Deferred  Extremities:  Markedly thin, muscle wasting bilateral upper and lower extremities  Neurologic:  Alert and  oriented x4 Psych:  Alert and cooperative. Normal mood and affect.  Intake/Output from previous day: 06/03 0701 - 06/04 0700 In: 1547.5 [P.O.:240; I.V.:257.5; IV Piggyback:1050] Out: -  Intake/Output this shift: No intake/output data recorded.  Lab Results: Recent Labs    04/04/18 1245 04/05/18 1513 04/06/18 0531  WBC 9.2 10.1 8.9  HGB 16.7 15.7 14.0  HCT 50.7 48.4 44.9  PLT 228 233 217   BMET Recent Labs    04/04/18 1245 04/05/18 1513 04/06/18 0531  NA 145 148* 150*  K 4.3 4.4 4.0  CL 104 107 113*  CO2 28 26 27   GLUCOSE 115* 106* 105*  BUN 26* 33* 30*  CREATININE 1.05 1.14 0.86  CALCIUM 10.4* 10.4* 9.7   LFT Recent Labs    04/04/18 1245 04/05/18 1513 04/06/18 0531  PROT 7.7 7.4 6.5  ALBUMIN 4.0 4.1 3.5  AST 19 28 32  ALT 23 28 33  ALKPHOS 84 87 76  BILITOT 1.3* 1.2 1.1    Studies/Results: Dg Chest 2 View  Result Date: 04/04/2018 CLINICAL DATA:  Cough, weight loss. EXAM: CHEST - 2 VIEW COMPARISON:  None. FINDINGS: 6 cm mass within the RIGHT upper lobe. LEFT lung is clear. Prominence of the RIGHT hilum could be due to associated lymphadenopathy or ectasia of the ascending thoracic aorta. Heart size is normal. No acute or suspicious osseous finding. IMPRESSION: RIGHT upper lobe mass measuring approximately 6 cm greatest  dimension. This is almost certainly a lung cancer. Recommend chest CT for further characterization and to exclude associated mediastinal and/or perihilar lymphadenopathy. Electronically Signed   By: Franki Cabot M.D.   On: 04/04/2018 13:25   Ct Chest W Contrast  Result Date: 04/04/2018 CLINICAL DATA:  Productive cough for the last several months. Worsening weakness. Weight loss. EXAM: CT CHEST, ABDOMEN, AND PELVIS WITH CONTRAST TECHNIQUE: Multidetector CT imaging of the chest, abdomen and pelvis was performed following the standard protocol during bolus administration of intravenous contrast. CONTRAST:  112mL ISOVUE-300 IOPAMIDOL (ISOVUE-300) INJECTION 61% COMPARISON:  Radiography same day. FINDINGS: CT CHEST FINDINGS Cardiovascular: Heart size is normal. No pericardial fluid. There is aortic atherosclerosis. There is aneurysmal dilatation of the ascending aorta with a maximal transverse diameter of 4.4 cm. There is extensive coronary artery calcification. Mediastinum/Nodes: There is right hilar lymphadenopathy measuring up to 3 cm. No mediastinal adenopathy. Esophagus is dilated and fluid-filled. There appears to be esophageal thickening distally. The possibility of esophageal carcinoma or stoppage I today is does exist. Lungs/Pleura: There is a partially necrotic peripheral right upper lobe mass measuring up to 5 cm in diameter consistent with primary lung cancer. There is benign appearing pleural and parenchymal scarring at both lung apices the tumor has a broad surface along the pleura. At 1 point, 1 could question if there is some nodular tumor extension into the region of the parietal pleura. Patient does not have any pleural fluid however. Musculoskeletal: Ordinary degenerative changes affect the thoracic spine. No sign of metastatic disease in the thorax. CT ABDOMEN PELVIS FINDINGS Hepatobiliary: Fatty change of the liver. No focal liver lesion is seen. No calcified gallstones. Pancreas: Normal Spleen:  Normal Adrenals/Urinary Tract: No adrenal mass. Kidneys are normal. Bladder is normal. Stomach/Bowel: Sigmoid diverticulosis without evidence of  diverticulitis. No acute or significant bowel finding in the abdomen. Vascular/Lymphatic: Extensive atherosclerotic disease of the abdominal aorta and its branch vessels. Infrarenal abdominal aortic aneurysm with maximal transverse diameter of 2.8 cm. 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 Maximal diameter on the right is 1.7 cm. IVC is normal. No retroperitoneal adenopathy. Reproductive: Negative Other: No free fluid or air. Musculoskeletal: Lower lumbar degenerative changes. No sign of metastatic disease in the lumbosacral spine or the pelvis. IMPRESSION: 5 cm partially necrotic mass in the posterior right upper lobe with a broad surface along the pleura. Question nodular pleural invasion. No pleural fluid however. Metastatic adenopathy in the right hilum measuring up to 3 cm. Thickening of the distal esophagus with dilated fluid filled esophagus proximal to that. This raises the possibility of a distal esophageal carcinoma. Findings could be seen with esophagitis. Aortic atherosclerosis. 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 Coronary artery calcification. Abdominal aortic atherosclerosis. Infrarenal abdominal aortic aneurysm with maximal diameter of 2.8 cm. Electronically Signed   By: Nelson Chimes M.D.   On: 04/04/2018 14:47   Ct Abdomen Pelvis W Contrast  Result Date: 04/04/2018 CLINICAL DATA:  Productive cough for the last several months. Worsening weakness. Weight loss. EXAM: CT CHEST, ABDOMEN, AND PELVIS WITH CONTRAST TECHNIQUE: Multidetector CT imaging of the chest, abdomen and pelvis was performed following the standard protocol during bolus administration of intravenous contrast. CONTRAST:  163mL ISOVUE-300 IOPAMIDOL (ISOVUE-300) INJECTION 61% COMPARISON:  Radiography same day. FINDINGS: CT CHEST  FINDINGS Cardiovascular: Heart size is normal. No pericardial fluid. There is aortic atherosclerosis. There is aneurysmal dilatation of the ascending aorta with a maximal transverse diameter of 4.4 cm. There is extensive coronary artery calcification. Mediastinum/Nodes: There is right hilar lymphadenopathy measuring up to 3 cm. No mediastinal adenopathy. Esophagus is dilated and fluid-filled. There appears to be esophageal thickening distally. The possibility of esophageal carcinoma or stoppage I today is does exist. Lungs/Pleura: There is a partially necrotic peripheral right upper lobe mass measuring up to 5 cm in diameter consistent with primary lung cancer. There is benign appearing pleural and parenchymal scarring at both lung apices the tumor has a broad surface along the pleura. At 1 point, 1 could question if there is some nodular tumor extension into the region of the parietal pleura. Patient does not have any pleural fluid however. Musculoskeletal: Ordinary degenerative changes affect the thoracic spine. No sign of metastatic disease in the thorax. CT ABDOMEN PELVIS FINDINGS Hepatobiliary: Fatty change of the liver. No focal liver lesion is seen. No calcified gallstones. Pancreas: Normal Spleen: Normal Adrenals/Urinary Tract: No adrenal mass. Kidneys are normal. Bladder is normal. Stomach/Bowel: Sigmoid diverticulosis without evidence of diverticulitis. No acute or significant bowel finding in the abdomen. Vascular/Lymphatic: Extensive atherosclerotic disease of the abdominal aorta and its branch vessels. Infrarenal abdominal aortic aneurysm with maximal transverse diameter of 2.8 cm. 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 Maximal diameter on the right is 1.7 cm. IVC is normal. No retroperitoneal adenopathy. Reproductive: Negative Other: No free fluid or air. Musculoskeletal: Lower lumbar degenerative changes. No sign of metastatic disease in the lumbosacral  spine or the pelvis. IMPRESSION: 5 cm partially necrotic mass in the posterior right upper lobe with a broad surface along the pleura. Question nodular pleural invasion. No pleural fluid however. Metastatic adenopathy in the right hilum measuring up to 3 cm. Thickening of the distal esophagus with dilated fluid filled esophagus proximal to that. This raises  the possibility of a distal esophageal carcinoma. Findings could be seen with esophagitis. Aortic atherosclerosis. 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 Coronary artery calcification. Abdominal aortic atherosclerosis. Infrarenal abdominal aortic aneurysm with maximal diameter of 2.8 cm. Electronically Signed   By: Nelson Chimes M.D.   On: 04/04/2018 14:47    Impression: 67 year old male presenting with massive weight loss, decreased oral intake, dysphagia, inability to tolerate solids, and CT chest/abd/pelvis with findings of necrotic mass in posterior right upper lobe, thickening of distal esophagus with dilated fluid filled esophagus, concerning for carcinoma. Will pursue EGD today with biopsies. Discussed risks and benefits with patient, who stated understanding. He has been NPO since midnight.   Plan: Remain NPO EGD with Dr. Oneida Alar today. Discussed risks and benefits including dilation if appropriate, although concerning for mass as culprit for symptoms. Stated understanding   Annitta Needs, PhD, ANP-BC [facility]     LOS: 1 day    04/06/2018, 9:21 AM

## 2018-04-06 NOTE — Progress Notes (Signed)
PROGRESS NOTE    Joshua Schwartz  HBZ:[id number] DOB: Aug 05, 1951 DOA: 04/05/2018 PCP: Patient, No Pcp Per     Brief Narrative:  67 year old man admitted from home on 6/3 due to dysphagia, as well as nausea and vomiting.  He has a 6-month history of weight loss (has lost 40 pounds in 2 months), cough and poor appetite.  Came into the emergency department on 6/2.  At that time a CT scan of the chest showed a 5 cm partially necrotic mass in the posterior right lobe, metastatic adenopathy in the right hilum, thickening of the distal esophagus with dilated fluid-filled esophagus raising concern for esophageal carcinoma.  There is also a 4.4 cm ascending thoracic aortic aneurysm as well as a 2.8 cm infrarenal abdominal aortic aneurysm.  He was discharged with plans for outpatient CT-guided biopsy and oncology follow-up, however returned on 6/3 due to inability to tolerate food and admission was requested.   Assessment & Plan:   Principal Problem:   Intractable nausea and vomiting Active Problems:   Tobacco abuse   GERD (gastroesophageal reflux disease)   Weight loss, abnormal   Aortic aneurysm (HCC)   Dysphagia   Severe protein caloric malnutrition -With severe weight loss, adult failure to thrive, dysphagia and inability to tolerate solids. -CT scan of the chest abdomen and pelvis is concerning for the possibility of both a lung mass as well as possibly an esophageal malignancy. -We will request dietitian evaluation. -GI has already seen patient and is planning on EGD today to further assess esophagus, perform biopsies and possibly perform dilatation if appropriate. -Pending pathology reports from esophagus, may need to consider CT-guided biopsy of lung mass for diagnosis. -Would also benefit from PET scan. -Once EGD complete and cleared by GI, as long as he is able to tolerate orals, believe that work-up for his malignancy can be accomplished in the outpatient setting.  Tobacco abuse -I  have discussed tobacco cessation with the patient.  I have counseled the patient regarding the negative impacts of continued tobacco use including but not limited to lung cancer, COPD, and cardiovascular disease.  I have discussed alternatives to tobacco and modalities that may help facilitate tobacco cessation including but not limited to biofeedback, hypnosis, and medications.  Total time spent with tobacco counseling was 4 minutes.  Aortic aneurysm -Both ascending and infrarenal abdominal. -Ascending thoracic aortic aneurysm measures 4.4 cm in diameter. -I have discussed case with Dr. Halford Chessman with cardiothoracic surgery, he recommends outpatient follow-up with serial CT scans, no surgery at present.  Hypertension -For now well-controlled on IV hydralazine boluses.   -Needs good blood pressure control giving ascending thoracic aneurysm. -Once taking orals more consistently can start oral antihypertensive regimen.    DVT prophylaxis: SCDs Code Status: Full code Family Communication: Patient only Disposition Plan: Pending medical stability, discharge home  Consultants:   GI  Procedures:   EGD planned for 6/4.  Antimicrobials:  Anti-infectives (From admission, onward)   None       Subjective: Feeling very fatigued, sleepy, has had difficulty taking even sips of water overnight.  Objective: Vitals:   04/05/18 2105 04/05/18 2136 04/06/18 0123 04/06/18 0713  BP: (!) 175/115 (!) 170/97 112/82 (!) 141/98  Pulse:  73 84 83  Resp:  16 16   Temp:  98.3 F (36.8 C) 98.4 F (36.9 C) 97.8 F (36.6 C)  TempSrc:  Oral Oral Oral  SpO2:  92% 98% 100%  Weight:  44 kg (97 lb)  Height:  5\' 7"  (1.702 m)      Intake/Output Summary (Last 24 hours) at 04/06/2018 0739 Last data filed at 04/06/2018 0310 Gross per 24 hour  Intake 1547.5 ml  Output -  Net 1547.5 ml   Filed Weights   04/05/18 1325 04/05/18 2136  Weight: 45.3 kg (99 lb 14.4 oz) 44 kg (97 lb)     Examination:  General exam: Alert, awake, oriented x 3 Respiratory system: Clear to auscultation. Respiratory effort normal. Cardiovascular system:RRR. No murmurs, rubs, gallops. Gastrointestinal system: Abdomen is nondistended, soft and nontender. Pulsatile abdominal mass, normal bowel sounds. Protuberant hip bones. Central nervous system: Alert and oriented. No focal neurological deficits. Extremities: No C/C/E, +pedal pulses Skin: No rashes, lesions or ulcers Psychiatry: Judgement and insight appear normal. Mood & affect appropriate.     Data Reviewed: I have personally reviewed following labs and imaging studies  CBC: Recent Labs  Lab 04/04/18 1245 04/05/18 1513 04/06/18 0531  WBC 9.2 10.1 8.9  NEUTROABS  --  8.4*  --   HGB 16.7 15.7 14.0  HCT 50.7 48.4 44.9  MCV 97.1 98.0 99.8  PLT 228 233 315   Basic Metabolic Panel: Recent Labs  Lab 04/04/18 1245 04/05/18 1513 04/06/18 0531  NA 145 148* 150*  K 4.3 4.4 4.0  CL 104 107 113*  CO2 28 26 27   GLUCOSE 115* 106* 105*  BUN 26* 33* 30*  CREATININE 1.05 1.14 0.86  CALCIUM 10.4* 10.4* 9.7   GFR: Estimated Creatinine Clearance: 53.3 mL/min (by C-G formula based on SCr of 0.86 mg/dL). Liver Function Tests: Recent Labs  Lab 04/04/18 1245 04/05/18 1513 04/06/18 0531  AST 19 28 32  ALT 23 28 33  ALKPHOS 84 87 76  BILITOT 1.3* 1.2 1.1  PROT 7.7 7.4 6.5  ALBUMIN 4.0 4.1 3.5   Recent Labs  Lab 04/04/18 1245  LIPASE 32   No results for input(s): AMMONIA in the last 168 hours. Coagulation Profile: No results for input(s): INR, PROTIME in the last 168 hours. Cardiac Enzymes: No results for input(s): CKTOTAL, CKMB, CKMBINDEX, TROPONINI in the last 168 hours. BNP (last 3 results) No results for input(s): PROBNP in the last 8760 hours. HbA1C: No results for input(s): HGBA1C in the last 72 hours. CBG: No results for input(s): GLUCAP in the last 168 hours. Lipid Profile: No results for input(s): CHOL, HDL,  LDLCALC, TRIG, CHOLHDL, LDLDIRECT in the last 72 hours. Thyroid Function Tests: No results for input(s): TSH, T4TOTAL, FREET4, T3FREE, THYROIDAB in the last 72 hours. Anemia Panel: No results for input(s): VITAMINB12, FOLATE, FERRITIN, TIBC, IRON, RETICCTPCT in the last 72 hours. Urine analysis:    Component Value Date/Time   COLORURINE AMBER (A) 04/04/2018 1400   APPEARANCEUR HAZY (A) 04/04/2018 1400   LABSPEC 1.026 04/04/2018 1400   PHURINE 5.0 04/04/2018 1400   GLUCOSEU NEGATIVE 04/04/2018 1400   HGBUR NEGATIVE 04/04/2018 1400   BILIRUBINUR NEGATIVE 04/04/2018 1400   KETONESUR 20 (A) 04/04/2018 1400   PROTEINUR 30 (A) 04/04/2018 1400   NITRITE NEGATIVE 04/04/2018 1400   LEUKOCYTESUR NEGATIVE 04/04/2018 1400   Sepsis Labs: @LABRCNTIP (procalcitonin:4,lacticidven:4)  )No results found for this or any previous visit (from the past 240 hour(s)).       Radiology Studies: Dg Chest 2 View  Result Date: 04/04/2018 CLINICAL DATA:  Cough, weight loss. EXAM: CHEST - 2 VIEW COMPARISON:  None. FINDINGS: 6 cm mass within the RIGHT upper lobe. LEFT lung is clear. Prominence of the RIGHT hilum could be  due to associated lymphadenopathy or ectasia of the ascending thoracic aorta. Heart size is normal. No acute or suspicious osseous finding. IMPRESSION: RIGHT upper lobe mass measuring approximately 6 cm greatest dimension. This is almost certainly a lung cancer. Recommend chest CT for further characterization and to exclude associated mediastinal and/or perihilar lymphadenopathy. Electronically Signed   By: Franki Cabot M.D.   On: 04/04/2018 13:25   Ct Chest W Contrast  Result Date: 04/04/2018 CLINICAL DATA:  Productive cough for the last several months. Worsening weakness. Weight loss. EXAM: CT CHEST, ABDOMEN, AND PELVIS WITH CONTRAST TECHNIQUE: Multidetector CT imaging of the chest, abdomen and pelvis was performed following the standard protocol during bolus administration of intravenous  contrast. CONTRAST:  122mL ISOVUE-300 IOPAMIDOL (ISOVUE-300) INJECTION 61% COMPARISON:  Radiography same day. FINDINGS: CT CHEST FINDINGS Cardiovascular: Heart size is normal. No pericardial fluid. There is aortic atherosclerosis. There is aneurysmal dilatation of the ascending aorta with a maximal transverse diameter of 4.4 cm. There is extensive coronary artery calcification. Mediastinum/Nodes: There is right hilar lymphadenopathy measuring up to 3 cm. No mediastinal adenopathy. Esophagus is dilated and fluid-filled. There appears to be esophageal thickening distally. The possibility of esophageal carcinoma or stoppage I today is does exist. Lungs/Pleura: There is a partially necrotic peripheral right upper lobe mass measuring up to 5 cm in diameter consistent with primary lung cancer. There is benign appearing pleural and parenchymal scarring at both lung apices the tumor has a broad surface along the pleura. At 1 point, 1 could question if there is some nodular tumor extension into the region of the parietal pleura. Patient does not have any pleural fluid however. Musculoskeletal: Ordinary degenerative changes affect the thoracic spine. No sign of metastatic disease in the thorax. CT ABDOMEN PELVIS FINDINGS Hepatobiliary: Fatty change of the liver. No focal liver lesion is seen. No calcified gallstones. Pancreas: Normal Spleen: Normal Adrenals/Urinary Tract: No adrenal mass. Kidneys are normal. Bladder is normal. Stomach/Bowel: Sigmoid diverticulosis without evidence of diverticulitis. No acute or significant bowel finding in the abdomen. Vascular/Lymphatic: Extensive atherosclerotic disease of the abdominal aorta and its branch vessels. Infrarenal abdominal aortic aneurysm with maximal transverse diameter of 2.8 cm. 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 Maximal diameter on the right is 1.7 cm. IVC is normal. No retroperitoneal adenopathy. Reproductive: Negative  Other: No free fluid or air. Musculoskeletal: Lower lumbar degenerative changes. No sign of metastatic disease in the lumbosacral spine or the pelvis. IMPRESSION: 5 cm partially necrotic mass in the posterior right upper lobe with a broad surface along the pleura. Question nodular pleural invasion. No pleural fluid however. Metastatic adenopathy in the right hilum measuring up to 3 cm. Thickening of the distal esophagus with dilated fluid filled esophagus proximal to that. This raises the possibility of a distal esophageal carcinoma. Findings could be seen with esophagitis. Aortic atherosclerosis. 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 Coronary artery calcification. Abdominal aortic atherosclerosis. Infrarenal abdominal aortic aneurysm with maximal diameter of 2.8 cm. Electronically Signed   By: Nelson Chimes M.D.   On: 04/04/2018 14:47   Ct Abdomen Pelvis W Contrast  Result Date: 04/04/2018 CLINICAL DATA:  Productive cough for the last several months. Worsening weakness. Weight loss. EXAM: CT CHEST, ABDOMEN, AND PELVIS WITH CONTRAST TECHNIQUE: Multidetector CT imaging of the chest, abdomen and pelvis was performed following the standard protocol during bolus administration of intravenous contrast. CONTRAST:  162mL ISOVUE-300 IOPAMIDOL (ISOVUE-300) INJECTION 61% COMPARISON:  Radiography same day. FINDINGS: CT  CHEST FINDINGS Cardiovascular: Heart size is normal. No pericardial fluid. There is aortic atherosclerosis. There is aneurysmal dilatation of the ascending aorta with a maximal transverse diameter of 4.4 cm. There is extensive coronary artery calcification. Mediastinum/Nodes: There is right hilar lymphadenopathy measuring up to 3 cm. No mediastinal adenopathy. Esophagus is dilated and fluid-filled. There appears to be esophageal thickening distally. The possibility of esophageal carcinoma or stoppage I today is does exist. Lungs/Pleura: There is a partially necrotic peripheral right upper  lobe mass measuring up to 5 cm in diameter consistent with primary lung cancer. There is benign appearing pleural and parenchymal scarring at both lung apices the tumor has a broad surface along the pleura. At 1 point, 1 could question if there is some nodular tumor extension into the region of the parietal pleura. Patient does not have any pleural fluid however. Musculoskeletal: Ordinary degenerative changes affect the thoracic spine. No sign of metastatic disease in the thorax. CT ABDOMEN PELVIS FINDINGS Hepatobiliary: Fatty change of the liver. No focal liver lesion is seen. No calcified gallstones. Pancreas: Normal Spleen: Normal Adrenals/Urinary Tract: No adrenal mass. Kidneys are normal. Bladder is normal. Stomach/Bowel: Sigmoid diverticulosis without evidence of diverticulitis. No acute or significant bowel finding in the abdomen. Vascular/Lymphatic: Extensive atherosclerotic disease of the abdominal aorta and its branch vessels. Infrarenal abdominal aortic aneurysm with maximal transverse diameter of 2.8 cm. 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 Maximal diameter on the right is 1.7 cm. IVC is normal. No retroperitoneal adenopathy. Reproductive: Negative Other: No free fluid or air. Musculoskeletal: Lower lumbar degenerative changes. No sign of metastatic disease in the lumbosacral spine or the pelvis. IMPRESSION: 5 cm partially necrotic mass in the posterior right upper lobe with a broad surface along the pleura. Question nodular pleural invasion. No pleural fluid however. Metastatic adenopathy in the right hilum measuring up to 3 cm. Thickening of the distal esophagus with dilated fluid filled esophagus proximal to that. This raises the possibility of a distal esophageal carcinoma. Findings could be seen with esophagitis. Aortic atherosclerosis. 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 Coronary artery calcification. Abdominal aortic  atherosclerosis. Infrarenal abdominal aortic aneurysm with maximal diameter of 2.8 cm. Electronically Signed   By: Nelson Chimes M.D.   On: 04/04/2018 14:47        Scheduled Meds: . nicotine  7 mg Transdermal Daily  . [START ON 04/07/2018] pneumococcal 23 valent vaccine  0.5 mL Intramuscular Tomorrow-1000   Continuous Infusions: . famotidine (PEPCID) IV Stopped (04/06/18 0015)  . lactated ringers 75 mL/hr at 04/05/18 2344     LOS: 1 day    Time spent: 35 minutes. Greater than 50% of this time was spent in direct contact with the patient, coordinating care and discussing relevant ongoing clinical issues, including discussing findings of both abdominal/thoracic aortic aneurysms and potential lung/esophageal malignancies, answering all his questions and describing plans for follow-up.  This time is separate of time spent for tobacco cessation counseling.     Lelon Frohlich, MD [facility] Pager [phone]  If 7PM-7AM, please contact night-coverage [email or web address] Password [facility] 04/06/2018, 7:39 AM

## 2018-04-06 NOTE — Progress Notes (Addendum)
Patient ID: Joshua Schwartz, male   DOB: 01/14/1951, 67 y.o.   MRN: [id number]  Reviewed case with DR. HENRY DANIS, LBGI. RECOMMENDED SURGICAL JEJUNOSTOMY TUBE AND ONCOLOGY/RADIATION ONCOLOGY CONSULT. 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

## 2018-04-07 ENCOUNTER — Inpatient Hospital Stay (HOSPITAL_COMMUNITY): Payer: Medicare Other

## 2018-04-07 ENCOUNTER — Encounter (HOSPITAL_COMMUNITY): Payer: Self-pay | Admitting: Gastroenterology

## 2018-04-07 DIAGNOSIS — E43 Unspecified severe protein-calorie malnutrition: Secondary | ICD-10-CM

## 2018-04-07 DIAGNOSIS — R918 Other nonspecific abnormal finding of lung field: Secondary | ICD-10-CM

## 2018-04-07 DIAGNOSIS — I10 Essential (primary) hypertension: Secondary | ICD-10-CM

## 2018-04-07 DIAGNOSIS — K222 Esophageal obstruction: Secondary | ICD-10-CM

## 2018-04-07 DIAGNOSIS — Z87891 Personal history of nicotine dependence: Secondary | ICD-10-CM

## 2018-04-07 LAB — HIV ANTIBODY (ROUTINE TESTING W REFLEX): HIV SCREEN 4TH GENERATION: NONREACTIVE

## 2018-04-07 MED ORDER — DEXTROSE 5 % IV SOLN
INTRAVENOUS | Status: DC
  Administered 2018-04-07 – 2018-04-08 (×3): via INTRAVENOUS

## 2018-04-07 MED ORDER — ENSURE ENLIVE PO LIQD: 237.0000 mL | Freq: Four times a day (QID) | ORAL | Status: DC

## 2018-04-07 MED ORDER — GADOBENATE DIMEGLUMINE 529 MG/ML IV SOLN
8.0000 mL | Freq: Once | INTRAVENOUS | Status: AC | PRN
  Administered 2018-04-07: 8 mL via INTRAVENOUS

## 2018-04-07 NOTE — Progress Notes (Signed)
Patient received tap water enema x 1 this morning by nursing student and nursing instructor. Tolerated well per their report. Pt reported small amount of stool returned after receiving enema. Reports poor appetite and does not like Ensure. States he is better able to tolerate the taste of the Great value brand of supplements comparable to Ensure. Girlfriend states she will bring in from home. Joshua Foil, RN

## 2018-04-07 NOTE — Progress Notes (Addendum)
Talked with interventional radiology at [facility]. They state that order for CT guided biopsy should be placed once patient has been discharged. Orders must be placed for:  Outpatient at [facility] in order for Radiologist to approve and Radiology to call patient.  Phone number for IR at [facility] [phone].

## 2018-04-07 NOTE — Progress Notes (Addendum)
Subjective:  Patient was able to drink and eat some after EGD. On a full liquid diet. No vomiting or regurgitation. However still with early satiety. No abd pain.   Objective: Vital signs in last 24 hours: Temp:  [97.4 F (36.3 C)-99.2 F (37.3 C)] 98 F (36.7 C) (06/05 0624) Pulse Rate:  [52-121] 57 (06/05 0624) Resp:  [11-31] 15 (06/04 1505) BP: (99-197)/(68-116) 146/94 (06/05 0624) SpO2:  [92 %-100 %] 100 % (06/05 0624) Last BM Date: 04/03/18 General:   Alert,  Thin, WM in cooperative in NAD Head:  Normocephalic and atraumatic. Eyes:  Sclera clear, no icterus.  Abdomen:  Soft, nontender and nondistended.   Normal bowel sounds, without guarding, and without rebound.   Extremities:  Without clubbing, deformity or edema. Neurologic:  Alert and  oriented x4;  grossly normal neurologically. Skin:  Intact without significant lesions or rashes. Psych:  Alert and cooperative. Normal mood and affect.  Intake/Output from previous day: 06/04 0701 - 06/05 0700 In: 1740.8 [P.O.:240; I.V.:500.8; IV Piggyback:1000] Out: -  Intake/Output this shift: No intake/output data recorded.  Lab Results: CBC Recent Labs    04/04/18 1245 04/05/18 1513 04/06/18 0531  WBC 9.2 10.1 8.9  HGB 16.7 15.7 14.0  HCT 50.7 48.4 44.9  MCV 97.1 98.0 99.8  PLT 228 233 217   BMET Recent Labs    04/04/18 1245 04/05/18 1513 04/06/18 0531  NA 145 148* 150*  K 4.3 4.4 4.0  CL 104 107 113*  CO2 28 26 27   GLUCOSE 115* 106* 105*  BUN 26* 33* 30*  CREATININE 1.05 1.14 0.86  CALCIUM 10.4* 10.4* 9.7   LFTs Recent Labs    04/04/18 1245 04/05/18 1513 04/06/18 0531  BILITOT 1.3* 1.2 1.1  ALKPHOS 84 87 76  AST 19 28 32  ALT 23 28 33  PROT 7.7 7.4 6.5  ALBUMIN 4.0 4.1 3.5   Recent Labs    04/04/18 1245  LIPASE 32   PT/INR No results for input(s): LABPROT, INR in the last 72 hours.    Imaging Studies: Dg Chest 2 View  Result Date: 04/04/2018 CLINICAL DATA:  Cough, weight loss. EXAM: CHEST -  2 VIEW COMPARISON:  None. FINDINGS: 6 cm mass within the RIGHT upper lobe. LEFT lung is clear. Prominence of the RIGHT hilum could be due to associated lymphadenopathy or ectasia of the ascending thoracic aorta. Heart size is normal. No acute or suspicious osseous finding. IMPRESSION: RIGHT upper lobe mass measuring approximately 6 cm greatest dimension. This is almost certainly a lung cancer. Recommend chest CT for further characterization and to exclude associated mediastinal and/or perihilar lymphadenopathy. Electronically Signed   By: Franki Cabot M.D.   On: 04/04/2018 13:25   Ct Chest W Contrast  Result Date: 04/04/2018 CLINICAL DATA:  Productive cough for the last several months. Worsening weakness. Weight loss. EXAM: CT CHEST, ABDOMEN, AND PELVIS WITH CONTRAST TECHNIQUE: Multidetector CT imaging of the chest, abdomen and pelvis was performed following the standard protocol during bolus administration of intravenous contrast. CONTRAST:  117mL ISOVUE-300 IOPAMIDOL (ISOVUE-300) INJECTION 61% COMPARISON:  Radiography same day. FINDINGS: CT CHEST FINDINGS Cardiovascular: Heart size is normal. No pericardial fluid. There is aortic atherosclerosis. There is aneurysmal dilatation of the ascending aorta with a maximal transverse diameter of 4.4 cm. There is extensive coronary artery calcification. Mediastinum/Nodes: There is right hilar lymphadenopathy measuring up to 3 cm. No mediastinal adenopathy. Esophagus is dilated and fluid-filled. There appears to be esophageal thickening distally. The possibility of esophageal  carcinoma or stoppage I today is does exist. Lungs/Pleura: There is a partially necrotic peripheral right upper lobe mass measuring up to 5 cm in diameter consistent with primary lung cancer. There is benign appearing pleural and parenchymal scarring at both lung apices the tumor has a broad surface along the pleura. At 1 point, 1 could question if there is some nodular tumor extension into the  region of the parietal pleura. Patient does not have any pleural fluid however. Musculoskeletal: Ordinary degenerative changes affect the thoracic spine. No sign of metastatic disease in the thorax. CT ABDOMEN PELVIS FINDINGS Hepatobiliary: Fatty change of the liver. No focal liver lesion is seen. No calcified gallstones. Pancreas: Normal Spleen: Normal Adrenals/Urinary Tract: No adrenal mass. Kidneys are normal. Bladder is normal. Stomach/Bowel: Sigmoid diverticulosis without evidence of diverticulitis. No acute or significant bowel finding in the abdomen. Vascular/Lymphatic: Extensive atherosclerotic disease of the abdominal aorta and its branch vessels. Infrarenal abdominal aortic aneurysm with maximal transverse diameter of 2.8 cm. 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 Maximal diameter on the right is 1.7 cm. IVC is normal. No retroperitoneal adenopathy. Reproductive: Negative Other: No free fluid or air. Musculoskeletal: Lower lumbar degenerative changes. No sign of metastatic disease in the lumbosacral spine or the pelvis. IMPRESSION: 5 cm partially necrotic mass in the posterior right upper lobe with a broad surface along the pleura. Question nodular pleural invasion. No pleural fluid however. Metastatic adenopathy in the right hilum measuring up to 3 cm. Thickening of the distal esophagus with dilated fluid filled esophagus proximal to that. This raises the possibility of a distal esophageal carcinoma. Findings could be seen with esophagitis. Aortic atherosclerosis. 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 Coronary artery calcification. Abdominal aortic atherosclerosis. Infrarenal abdominal aortic aneurysm with maximal diameter of 2.8 cm. Electronically Signed   By: Nelson Chimes M.D.   On: 04/04/2018 14:47   Ct Abdomen Pelvis W Contrast  Result Date: 04/04/2018 CLINICAL DATA:  Productive cough for the last several months. Worsening weakness.  Weight loss. EXAM: CT CHEST, ABDOMEN, AND PELVIS WITH CONTRAST TECHNIQUE: Multidetector CT imaging of the chest, abdomen and pelvis was performed following the standard protocol during bolus administration of intravenous contrast. CONTRAST:  17mL ISOVUE-300 IOPAMIDOL (ISOVUE-300) INJECTION 61% COMPARISON:  Radiography same day. FINDINGS: CT CHEST FINDINGS Cardiovascular: Heart size is normal. No pericardial fluid. There is aortic atherosclerosis. There is aneurysmal dilatation of the ascending aorta with a maximal transverse diameter of 4.4 cm. There is extensive coronary artery calcification. Mediastinum/Nodes: There is right hilar lymphadenopathy measuring up to 3 cm. No mediastinal adenopathy. Esophagus is dilated and fluid-filled. There appears to be esophageal thickening distally. The possibility of esophageal carcinoma or stoppage I today is does exist. Lungs/Pleura: There is a partially necrotic peripheral right upper lobe mass measuring up to 5 cm in diameter consistent with primary lung cancer. There is benign appearing pleural and parenchymal scarring at both lung apices the tumor has a broad surface along the pleura. At 1 point, 1 could question if there is some nodular tumor extension into the region of the parietal pleura. Patient does not have any pleural fluid however. Musculoskeletal: Ordinary degenerative changes affect the thoracic spine. No sign of metastatic disease in the thorax. CT ABDOMEN PELVIS FINDINGS Hepatobiliary: Fatty change of the liver. No focal liver lesion is seen. No calcified gallstones. Pancreas: Normal Spleen: Normal Adrenals/Urinary Tract: No adrenal mass. Kidneys are normal. Bladder is normal. Stomach/Bowel: Sigmoid diverticulosis without evidence of  diverticulitis. No acute or significant bowel finding in the abdomen. Vascular/Lymphatic: Extensive atherosclerotic disease of the abdominal aorta and its branch vessels. Infrarenal abdominal aortic aneurysm with maximal  transverse diameter of 2.8 cm. 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 Maximal diameter on the right is 1.7 cm. IVC is normal. No retroperitoneal adenopathy. Reproductive: Negative Other: No free fluid or air. Musculoskeletal: Lower lumbar degenerative changes. No sign of metastatic disease in the lumbosacral spine or the pelvis. IMPRESSION: 5 cm partially necrotic mass in the posterior right upper lobe with a broad surface along the pleura. Question nodular pleural invasion. No pleural fluid however. Metastatic adenopathy in the right hilum measuring up to 3 cm. Thickening of the distal esophagus with dilated fluid filled esophagus proximal to that. This raises the possibility of a distal esophageal carcinoma. Findings could be seen with esophagitis. Aortic atherosclerosis. 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 Coronary artery calcification. Abdominal aortic atherosclerosis. Infrarenal abdominal aortic aneurysm with maximal diameter of 2.8 cm. Electronically Signed   By: Nelson Chimes M.D.   On: 04/04/2018 14:47  [2 weeks]   Assessment: 67 y/o male with necrotic mass in the posterior right upper lobe lung and malignant-appearing esophageal stenosis.  Patient with significant dysphagia, massive weight loss.   EGD yesterday with malignant appearing esophageal stenosis at 40-45 CM from incisors.  Biopsies pending.  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  Due to esophageal obstruction, feeding tube cannot be placed via EGD and given significant pyloric stenosis patient needs to have surgically placed jejunostomy tube.    Hypernatremia per attending.   Plan: 1. Recommend both general surgery consult and oncology consult.  Both pending. 2. Management of hypernatremia per attending. 3. Patient needs jejunostomy tube prior to discharge.  4. F/u pending path.  5. Enema as per patient's  request for feeling of constipation.   Laureen Ochs. Bernarda Caffey [facility] [phone] 6/5/20198:59 AM     LOS: 2 days

## 2018-04-07 NOTE — Consult Note (Signed)
[facility] Consultation Oncology  Name: Joshua Schwartz      MRN: [id number]    Location: A312/A312-01  Date: 04/07/2018 Time:12:58 PM   REFERRING PHYSICIAN:  Dr. Oneida Alar  REASON FOR CONSULT:  Malignant appearing esophageal stenosis and right upper lobe mass with hilar adenopathy.   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 Joshua Schwartz is a 67 year old very pleasant white male who is seen in consultation today for further management of abnormal findings on CT scan of the chest and EGD.  He came into the hospital unable to eat and lost about 40+ pounds in the last 2 months.  She lived at an apartment by himself prior to this admission.  He smoked about 53 years and quit 3 and half years ago.  He vapes now.  A CT scan of the chest, abdomen and pelvis on 04/04/2018 showed a 5 cm partially necrotic mass in the posterior right upper lobe with metastatic adenopathy in the right hilum measuring up to 3 cm.  There was also thickening of the distal esophagus with dilated fluid-filled esophagus proximal to that.  He was admitted to the hospital and underwent EGD on 04/06/2018 which showed malignant appearing stenosis of the distal esophagus extending from 40 cm to 45 cm from the incisors.  Stenosis measured 9 mm.  This was dilated and was biopsied.  Food was found in the distal esophagus which was removed.  A pediatric gastroscope was able to reach up to pylorus which showed benign appearing intrinsic moderate stenosis.  This was unable to be dilated. He is able to tolerate liquid foods at this time.  He is lying in bed and is able to walk back and forth from the bathroom.  A friend of his was present during the interview in the room.  There are no immediate family members.  He denies any family history of malignancy.  He does report on and off headaches which are new. PAST MEDICAL HISTORY:   Past Medical History:  Diagnosis Date  . Esophageal mass   . GERD  (gastroesophageal reflux disease)    denies chronic history  . HOH (hard of hearing)   . Hypertension     ALLERGIES: Allergies  Allergen Reactions  . Codeine     Altered mental status      MEDICATIONS: I have reviewed the patient's current medications.     PAST SURGICAL HISTORY Past Surgical History:  Procedure Laterality Date  . EYE SURGERY     as a child: artifical tear ducts    FAMILY HISTORY: Family History  Problem Relation Age of Onset  . Colon cancer Neg Hx   . Colon polyps Neg Hx     SOCIAL HISTORY:  reports that he quit smoking about 2 years ago. He quit after 45.00 years of use. He has never used smokeless tobacco. He reports that he drank alcohol. He reports that he does not use drugs.  PERFORMANCE STATUS: The patient's performance status is 2 - Symptomatic, <50% confined to bed  PHYSICAL EXAM: Most Recent Vital Signs: Blood pressure (!) 143/87, pulse 60, temperature 97.9 F (36.6 C), temperature source Oral, resp. rate 17, height 5' 7"  (1.702 m), weight 97 lb (44 kg), SpO2 100 %. BP (!) 143/87 (BP Location: Right Arm)   Pulse 60   Temp 97.9 F (36.6 C) (Oral)   Resp 17   Ht 5' 7"  (1.702 m)   Wt 97 lb (44 kg)  SpO2 100%   BMI 15.19 kg/m   HEENT: Oropharynx has no thrush. Chest: Bilateral air entry present. Cardiovascular: S1-S2 regular rate and rhythm. Abdomen: Scaphoid, no palpable organomegaly. Extremities: No edema or cyanosis. General: Very cachectic appearing male comfortably lying in bed. CNS: Awake alert oriented x3 no focal deficits answers questions appropriately.  Slightly hard at hearing. Lymphatics: No palpable lymphadenopathy in the neck axilla or inguinal region. LABORATORY DATA:  Results for orders placed or performed during the hospital encounter of 04/05/18 (from the past 48 hour(s))  CBC with Differential/Platelet     Status: Abnormal   Collection Time: 04/05/18  3:13 PM  Result Value Ref Range   WBC 10.1 4.0 - 10.5 K/uL    RBC 4.94 4.22 - 5.81 MIL/uL   Hemoglobin 15.7 13.0 - 17.0 g/dL   HCT 48.4 39.0 - 52.0 %   MCV 98.0 78.0 - 100.0 fL   MCH 31.8 26.0 - 34.0 pg   MCHC 32.4 30.0 - 36.0 g/dL   RDW 12.7 11.5 - 15.5 %   Platelets 233 150 - 400 K/uL   Neutrophils Relative % 82 %   Neutro Abs 8.4 (H) 1.7 - 7.7 K/uL   Lymphocytes Relative 9 %   Lymphs Abs 0.9 0.7 - 4.0 K/uL   Monocytes Relative 7 %   Monocytes Absolute 0.7 0.1 - 1.0 K/uL   Eosinophils Relative 1 %   Eosinophils Absolute 0.1 0.0 - 0.7 K/uL   Basophils Relative 1 %   Basophils Absolute 0.1 0.0 - 0.1 K/uL    Comment: Performed at [facility], [address] [place], [place] [address]  Comprehensive metabolic panel     Status: Abnormal   Collection Time: 04/05/18  3:13 PM  Result Value Ref Range   Sodium 148 (H) 135 - 145 mmol/L   Potassium 4.4 3.5 - 5.1 mmol/L   Chloride 107 101 - 111 mmol/L   CO2 26 22 - 32 mmol/L   Glucose, Bld 106 (H) 65 - 99 mg/dL   BUN 33 (H) 6 - 20 mg/dL   Creatinine, Ser 1.14 0.61 - 1.24 mg/dL   Calcium 10.4 (H) 8.9 - 10.3 mg/dL   Total Protein 7.4 6.5 - 8.1 g/dL   Albumin 4.1 3.5 - 5.0 g/dL   AST 28 15 - 41 U/L   ALT 28 17 - 63 U/L   Alkaline Phosphatase 87 38 - 126 U/L   Total Bilirubin 1.2 0.3 - 1.2 mg/dL   GFR calc non Af Amer >60 >60 mL/min   GFR calc Af Amer >60 >60 mL/min    Comment: (NOTE) The eGFR has been calculated using the CKD EPI equation. This calculation has not been validated in all clinical situations. eGFR's persistently <60 mL/min signify possible Chronic Kidney Disease.    Anion gap 15 5 - 15    Comment: Performed at [facility], [address], [place],  [address]  Comprehensive metabolic panel     Status: Abnormal   Collection Time: 04/06/18  5:31 AM  Result Value Ref Range   Sodium 150 (H) 135 - 145 mmol/L   Potassium 4.0 3.5 - 5.1 mmol/L   Chloride 113 (H) 101 - 111 mmol/L   CO2 27 22 - 32 mmol/L   Glucose, Bld 105 (H) 65 - 99 mg/dL   BUN 30 (H) 6 - 20 mg/dL    Creatinine, Ser 0.86 0.61 - 1.24 mg/dL   Calcium 9.7 8.9 - 10.3 mg/dL   Total Protein 6.5 6.5 -  8.1 g/dL   Albumin 3.5 3.5 - 5.0 g/dL   AST 32 15 - 41 U/L   ALT 33 17 - 63 U/L   Alkaline Phosphatase 76 38 - 126 U/L   Total Bilirubin 1.1 0.3 - 1.2 mg/dL   GFR calc non Af Amer >60 >60 mL/min   GFR calc Af Amer >60 >60 mL/min    Comment: (NOTE) The eGFR has been calculated using the CKD EPI equation. This calculation has not been validated in all clinical situations. eGFR's persistently <60 mL/min signify possible Chronic Kidney Disease.    Anion gap 10 5 - 15    Comment: Performed at [facility], [address], [place], [place] [address]  CBC     Status: None   Collection Time: 04/06/18  5:31 AM  Result Value Ref Range   WBC 8.9 4.0 - 10.5 K/uL   RBC 4.50 4.22 - 5.81 MIL/uL   Hemoglobin 14.0 13.0 - 17.0 g/dL   HCT 44.9 39.0 - 52.0 %   MCV 99.8 78.0 - 100.0 fL   MCH 31.1 26.0 - 34.0 pg   MCHC 31.2 30.0 - 36.0 g/dL   RDW 12.8 11.5 - 15.5 %   Platelets 217 150 - 400 K/uL    Comment: Performed at [facility], [address] [place], [place] [address]  HIV antibody     Status: None   Collection Time: 04/06/18  5:31 AM  Result Value Ref Range   HIV Screen 4th Generation wRfx Non Reactive Non Reactive    Comment: (NOTE) Performed At: [facility] [place], [place] [id number] Rush Farmer MD [id number] Performed at [facility], [address] [place], [place] [address]       RADIOGRAPHY: I have personally reviewed the images of CT scan of the chest, abdomen and pelvis dated 04/04/2018 which showed a 5 cm partially necrotic mass in the posterior right upper lobe with the broad surface along the pleura.  There is metastatic adenopathy in the right hilum measuring 3 cm.  Thickening of the distal esophagus present.  Aneurysmal dilatation of the ascending aorta measuring 4.4 cm.  Infrarenal abdominal aortic aneurysm measuring 2.8 cm.   PATHOLOGY:  Esophageal  biopsy results pending.   ASSESSMENT: 1.  5 cm partially necrotic right upper lobe lung mass with right hilar adenopathy measuring 3 cm, highly likely lung primary with a 50-year smoking history.  2.  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  3.  40+ pound weight loss in the last 2 months, unable to tolerate any solid foods secondary to stenosis  PLAN: 1.  Malignant stenosis of the distal esophagus: He had a EGD and biopsy on 04/06/2018.  Results are pending at this time.  He is able to tolerate liquids at this time.  He has a benign stricture at the pylorus.  Hence it was recommended for him to have a jejunostomy tube for nutrition.  Dr. Arnoldo Morale will see him later today.  Should the patient be discharged after the G-tube placement, we will see him early next week to discuss the results.  2.  Right lung mass with hilar adenopathy: I have discussed the findings on the CT scan with the patient and his friend in detail.  This is highly likely a second primary.  I have recommended CT-guided biopsy of the right upper lobe lung lesion.  Again we  can discuss the results of this biopsy early next week in our office.  I would recommend doing an MRI of the brain with and without gadolinium for lung cancer staging work-up as well as him complaining of headaches which are new onset.  All questions were answered. The patient knows to call the clinic with any problems, questions or concerns. We can certainly see the patient much sooner if necessary.    [name]  [phone]

## 2018-04-07 NOTE — Progress Notes (Signed)
Notified MD that patient states he prefers [employer] brand of supplement that he uses at home instead of Ensure available from hospital supply. MD placed order for patient to use his from home. Notified pharmacy to enter on [facility] for nursing documentation. Donavan Foil, RN

## 2018-04-07 NOTE — Progress Notes (Signed)
PROGRESS NOTE    NAYQUAN EVINGER  HFW:[id number]  DOB: Mar 23, 1951  DOA: 04/05/2018 PCP: Patient, No Pcp Per   Brief Admission Hx: TRASE BUNDA is a 67 y.o. male with medical history significant for significant tobacco abuse and GERD who presented to the ED yesterday with complaints of nausea and vomiting along with unintentional weight loss as well as poor appetite and cough.  He had multiple imaging studies done with CT chest as well as abdomen and pelvis demonstrating round right upper lobe 6 cm mass as well as possible malignancy with mediastinal nodes with concern of distal esophagitis.  There was also noted aneurysm of the ascending and descending aorta.  He was being set up for outpatient CT-guided biopsy and was sent home, but unfortunately continues to have difficulty with oral intake of both solids and liquids and has a persistent burning sensation to his substernal chest and epigastric region.  He has not been able to keep anything down and therefore returns to the ED.  In the ED vital signs are stable, but patient has elevated blood pressure readings of 027 systolic and over 741 diastolic.  Laboratory data unremarkable aside from mild sodium elevation of 148 and BUN 33.  Creatinine appears to be near baseline at 1.14 with prior 1.05.  Glucose is 106.  Imaging studies were reviewed with findings of right upper lobe mass as well as thickening of distal esophagus and aortic aneurysm is noted.  Patient has been given 1 L normal saline bolus.   Assessment & Plan:   Principal Problem:   Intractable nausea and vomiting Active Problems:   Hypertension   Lung Mass   Ascending aortic aneurysm   Tobacco abuse    1. Intractable nausea and vomiting with dysphagia.  There are concerns for distal esophagitis versus esophageal cancer based on imaging studies.  Will place consult for gastroenterology to further evaluate with likely need for EGD.  Zofran as needed for nausea and vomiting.  Maintain  on clear liquid diet with n.p.o. after midnight.  IV fluid hydration.  Will place on IV acid suppressive therapy. 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 Consult with oncology Dr. Delton Coombes. 2. Hypertension.  Does not currently appear to be in crisis.  Will monitor carefully and place on hydralazine pushes. 3. Lung mass.  Spoke with IR Dr. Anselm Pancoast who recommends PET-CT once discharged prior to biopsy. 4. Ascending aortic aneurysm and infrarenal abdominal aortic aneurysm.  Will need close surveillance and follow-up. 5. Tobacco abuse.  Nicotine patch.  Counseled on cessation.    DVT prophylaxis: SCDs Code Status: FULL CODE Family Communication: Patient Disposition Plan: Discharge home pending consult with Surgery. Outpatient CT-guided biopsy planned at [facility] pending Radiologist approval.   Consultants:  Neurology Dr. Delton Coombes; he states benign stricture at the pylorus. Recommends patient to have a jejunostomy tube for nutrition.   GI with Dr. Oneida Alar: recommends G-tube placement for nutrition. Endoscopy shows stenosis esophagus. Dilation of esophagus was attempted. Post dilation patient able to tolerate clear liquid diet.    Procedures:  Endoscopy with Dr. Oneida Alar.    Subjective: Patient states he has been able to swallow water and soup. He states that he is feeling ready to go home.  Objective: Vitals:   04/07/18 0624 04/07/18 0813 04/07/18 0903 04/07/18 1410  BP: (!) 146/94 (!) 178/93 (!) 143/87 133/90  Pulse: Marland Kitchen)  57 60  (!) 58  Resp:  17  18  Temp: 98 F (36.7 C) 97.9 F (36.6 C)  97.8 F (36.6 C)  TempSrc: Oral Oral    SpO2: 100% 100%  100%  Weight:      Height:        Intake/Output Summary (Last 24 hours) at 04/07/2018 1528 Last data filed at 04/07/2018 1020 Gross per 24 hour  Intake 740.83 ml  Output 350 ml  Net 390.83 ml    Filed Weights   04/05/18 1325 04/05/18 2136  Weight: 45.3 kg (99 lb 14.4 oz) 44 kg (97 lb)     REVIEW OF SYSTEMS  As per history otherwise all reviewed and reported negative  Exam:  General exam: Alert and oriented x 3, calm and cooperative. NAD. Respiratory system: Clear. No increased work of breathing. Cardiovascular system: S1 & S2 heard, RRR. No JVD, murmurs, gallops, clicks or pedal edema. Gastrointestinal system: Abdomen is nondistended, soft and nontender. Pulsatile abdominal mass. Normal bowel sounds heard. Central nervous system: Alert and oriented. No focal neurological deficits. Extremities: no CCE.  Data Reviewed: Basic Metabolic Panel: Recent Labs  Lab 04/04/18 1245 04/05/18 1513 04/06/18 0531  NA 145 148* 150*  K 4.3 4.4 4.0  CL 104 107 113*  CO2 28 26 27   GLUCOSE 115* 106* 105*  BUN 26* 33* 30*  CREATININE 1.05 1.14 0.86  CALCIUM 10.4* 10.4* 9.7   Liver Function Tests: Recent Labs  Lab 04/04/18 1245 04/05/18 1513 04/06/18 0531  AST 19 28 32  ALT 23 28 33  ALKPHOS 84 87 76  BILITOT 1.3* 1.2 1.1  PROT 7.7 7.4 6.5  ALBUMIN 4.0 4.1 3.5   Recent Labs  Lab 04/04/18 1245  LIPASE 32   No results for input(s): AMMONIA in the last 168 hours. CBC: Recent Labs  Lab 04/04/18 1245 04/05/18 1513 04/06/18 0531  WBC 9.2 10.1 8.9  NEUTROABS  --  8.4*  --   HGB 16.7 15.7 14.0  HCT 50.7 48.4 44.9  MCV 97.1 98.0 99.8  PLT 228 233 217   Cardiac Enzymes: No results for input(s): CKTOTAL, CKMB, CKMBINDEX, TROPONINI in the last 168 hours. CBG (last 3)  No results for input(s): GLUCAP in the last 72 hours. No results found for this or any previous visit (from the past 240 hour(s)).   Studies: No results found.   Scheduled Meds: . feeding supplement (ENSURE ENLIVE)  237 mL Oral QID  . feeding supplement (PRO-STAT SUGAR FREE 64)  30 mL Oral BID  . nicotine  7 mg Transdermal Daily   Continuous Infusions: . dextrose 75 mL/hr at 04/07/18 1035  .  famotidine (PEPCID) IV Stopped (04/07/18 1106)    Principal Problem:   Intractable nausea and vomiting Active Problems:   Tobacco abuse   GERD (gastroesophageal reflux disease)   Weight loss, abnormal   Aortic aneurysm (HCC)   Dysphagia   Food impaction of esophagus   Protein-calorie malnutrition, severe   Esophageal stricture   Lung mass   Time spent:   Ashok Norris, Student AGACNP  Attending:  Irwin Brakeman, MD, FAAFP [facility] Pager [phone]  If 7PM-7AM, please contact night-coverage [email or web address] Password [email or web address] 04/07/2018, 3:28 PM    LOS: 2 days

## 2018-04-08 DIAGNOSIS — R918 Other nonspecific abnormal finding of lung field: Secondary | ICD-10-CM

## 2018-04-08 DIAGNOSIS — Z720 Tobacco use: Secondary | ICD-10-CM

## 2018-04-08 DIAGNOSIS — E43 Unspecified severe protein-calorie malnutrition: Secondary | ICD-10-CM

## 2018-04-08 DIAGNOSIS — K219 Gastro-esophageal reflux disease without esophagitis: Secondary | ICD-10-CM

## 2018-04-08 MED ORDER — NICOTINE 14 MG/24HR TD PT24: 14.0000 mg | MEDICATED_PATCH | Freq: Every day | TRANSDERMAL | 0 refills | Status: DC

## 2018-04-08 MED ORDER — ENSURE ENLIVE PO LIQD: 237.0000 mL | Freq: Four times a day (QID) | ORAL | 0 refills | Status: AC

## 2018-04-08 MED ORDER — PRO-STAT SUGAR FREE PO LIQD: 30.0000 mL | Freq: Two times a day (BID) | ORAL | 0 refills | Status: AC

## 2018-04-08 MED ORDER — AMLODIPINE BESYLATE 5 MG PO TABS: 5.0000 mg | ORAL_TABLET | Freq: Every day | ORAL | 1 refills | Status: AC

## 2018-04-08 NOTE — Progress Notes (Signed)
Patient's IV removed.  Site WNL.  AVS reviewed with patient.  Verbalized understanding of discharge instructions, physician follow-up, medications.  Dr. Wynetta Emery notified of patient's blood pressure and Norvasc ordered.  Patient educated and verbalized understanding.  Patient transported by NT via w/c to main entrance at discharge.  Patient stable at time of discharge.

## 2018-04-08 NOTE — Progress Notes (Signed)
Subjective:  Tolerating full liquids but some nausea. No abd pain. Some results with enema yesterday.   Objective: Vital signs in last 24 hours: Temp:  [97.7 F (36.5 C)-98.3 F (36.8 C)] 98.3 F (36.8 C) (06/06 0617) Pulse Rate:  [58-69] 67 (06/06 0617) Resp:  [17-18] 18 (06/05 1600) BP: (133-178)/(87-99) 143/99 (06/06 0617) SpO2:  [95 %-100 %] 99 % (06/06 0617) Last BM Date: 04/07/18 General:   Alert,  Thin WM in NAD Head:  Normocephalic and atraumatic. Eyes:  Sclera clear, no icterus.  Abdomen:  Soft, nontender and nondistended.  Normal bowel sounds, without guarding, and without rebound.   Extremities:  Without clubbing, deformity or edema. Neurologic:  Alert and  oriented x4;  grossly normal neurologically. Skin:  Intact without significant lesions or rashes. Psych:  Alert and cooperative. Normal mood and affect.  Intake/Output from previous day: 06/05 0701 - 06/06 0700 In: 2126.3 [P.O.:720; I.V.:1306.3; IV Piggyback:100] Out: 600 [Urine:600] Intake/Output this shift: No intake/output data recorded.  Lab Results: CBC Recent Labs    04/05/18 1513 04/06/18 0531  WBC 10.1 8.9  HGB 15.7 14.0  HCT 48.4 44.9  MCV 98.0 99.8  PLT 233 217   BMET Recent Labs    04/05/18 1513 04/06/18 0531  NA 148* 150*  K 4.4 4.0  CL 107 113*  CO2 26 27  GLUCOSE 106* 105*  BUN 33* 30*  CREATININE 1.14 0.86  CALCIUM 10.4* 9.7   LFTs Recent Labs    04/05/18 1513 04/06/18 0531  BILITOT 1.2 1.1  ALKPHOS 87 76  AST 28 32  ALT 28 33  PROT 7.4 6.5  ALBUMIN 4.1 3.5   No results for input(s): LIPASE in the last 72 hours. PT/INR No results for input(s): LABPROT, INR in the last 72 hours.    Imaging Studies: Dg Chest 2 View  Result Date: 04/04/2018 CLINICAL DATA:  Cough, weight loss. EXAM: CHEST - 2 VIEW COMPARISON:  None. FINDINGS: 6 cm mass within the RIGHT upper lobe. LEFT lung is clear. Prominence of the RIGHT hilum could be due to associated lymphadenopathy or ectasia of  the ascending thoracic aorta. Heart size is normal. No acute or suspicious osseous finding. IMPRESSION: RIGHT upper lobe mass measuring approximately 6 cm greatest dimension. This is almost certainly a lung cancer. Recommend chest CT for further characterization and to exclude associated mediastinal and/or perihilar lymphadenopathy. Electronically Signed   By: Franki Cabot M.D.   On: 04/04/2018 13:25   Ct Chest W Contrast  Result Date: 04/04/2018 CLINICAL DATA:  Productive cough for the last several months. Worsening weakness. Weight loss. EXAM: CT CHEST, ABDOMEN, AND PELVIS WITH CONTRAST TECHNIQUE: Multidetector CT imaging of the chest, abdomen and pelvis was performed following the standard protocol during bolus administration of intravenous contrast. CONTRAST:  186mL ISOVUE-300 IOPAMIDOL (ISOVUE-300) INJECTION 61% COMPARISON:  Radiography same day. FINDINGS: CT CHEST FINDINGS Cardiovascular: Heart size is normal. No pericardial fluid. There is aortic atherosclerosis. There is aneurysmal dilatation of the ascending aorta with a maximal transverse diameter of 4.4 cm. There is extensive coronary artery calcification. Mediastinum/Nodes: There is right hilar lymphadenopathy measuring up to 3 cm. No mediastinal adenopathy. Esophagus is dilated and fluid-filled. There appears to be esophageal thickening distally. The possibility of esophageal carcinoma or stoppage I today is does exist. Lungs/Pleura: There is a partially necrotic peripheral right upper lobe mass measuring up to 5 cm in diameter consistent with primary lung cancer. There is benign appearing pleural and parenchymal scarring at both lung apices  the tumor has a broad surface along the pleura. At 1 point, 1 could question if there is some nodular tumor extension into the region of the parietal pleura. Patient does not have any pleural fluid however. Musculoskeletal: Ordinary degenerative changes affect the thoracic spine. No sign of metastatic disease in  the thorax. CT ABDOMEN PELVIS FINDINGS Hepatobiliary: Fatty change of the liver. No focal liver lesion is seen. No calcified gallstones. Pancreas: Normal Spleen: Normal Adrenals/Urinary Tract: No adrenal mass. Kidneys are normal. Bladder is normal. Stomach/Bowel: Sigmoid diverticulosis without evidence of diverticulitis. No acute or significant bowel finding in the abdomen. Vascular/Lymphatic: Extensive atherosclerotic disease of the abdominal aorta and its branch vessels. Infrarenal abdominal aortic aneurysm with maximal transverse diameter of 2.8 cm. 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 Maximal diameter on the right is 1.7 cm. IVC is normal. No retroperitoneal adenopathy. Reproductive: Negative Other: No free fluid or air. Musculoskeletal: Lower lumbar degenerative changes. No sign of metastatic disease in the lumbosacral spine or the pelvis. IMPRESSION: 5 cm partially necrotic mass in the posterior right upper lobe with a broad surface along the pleura. Question nodular pleural invasion. No pleural fluid however. Metastatic adenopathy in the right hilum measuring up to 3 cm. Thickening of the distal esophagus with dilated fluid filled esophagus proximal to that. This raises the possibility of a distal esophageal carcinoma. Findings could be seen with esophagitis. Aortic atherosclerosis. 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 Coronary artery calcification. Abdominal aortic atherosclerosis. Infrarenal abdominal aortic aneurysm with maximal diameter of 2.8 cm. Electronically Signed   By: Nelson Chimes M.D.   On: 04/04/2018 14:47   Mr Jeri Cos NL Contrast  Result Date: 04/07/2018 CLINICAL DATA:  Initial evaluation for lung cancer staging. EXAM: MRI HEAD WITHOUT AND WITH CONTRAST TECHNIQUE: Multiplanar, multiecho pulse sequences of the brain and surrounding structures were obtained without and with intravenous contrast. CONTRAST:  41mL MULTIHANCE  GADOBENATE DIMEGLUMINE 529 MG/ML IV SOLN COMPARISON:  Prior CT from 09/09/2007. FINDINGS: Brain: Generalized age-related cerebral atrophy. Mild T2/FLAIR hyperintensity within the periventricular and deep white matter both cerebral hemispheres, most consistent with chronic small vessel ischemic change, mild for age. No evidence for acute or subacute infarct. Gray-white matter differentiation maintained. Small remote right cerebellar infarct. No other areas of remote cortical infarction. No acute intracranial hemorrhage. Subcentimeter focus of susceptibility artifact within the central pons, consistent with a small chronic microhemorrhage, of doubtful significance in isolation. No mass lesion, midline shift, or mass effect. No hydrocephalus. No extra-axial fluid collection. No abnormal enhancement. No evidence for intracranial metastatic disease. Pituitary within normal limits. Vascular: Major intracranial vascular flow voids maintained left vertebral artery hypoplastic and likely terminates in PICA. Skull and upper cervical spine: Craniocervical junction within normal limits. Upper cervical spine normal. Single 5 mm T2 hyperintense enhancing lesion at the right frontal calvarium (series 12, image 62), indeterminate. No other discrete osseous lesion. Scalp soft tissues unremarkable. Sinuses/Orbits: Globes and orbital soft tissues within normal limits. Left maxillary sinus retention cyst. Paranasal sinuses are otherwise clear. Left mastoid effusion, likely sterile/benign. Inner ear structures normal. Other: None. IMPRESSION: 1. No evidence for intracranial metastatic disease. 2. Single 5 mm, indeterminate, but favored to reflect a benign etiology. Correlation with bone scan suggested for further evaluation. At the right frontal calvarium 3. Age-related cerebral atrophy with mild chronic small vessel ischemic disease, with small remote right cerebellar infarct. Electronically Signed   By: Jeannine Boga M.D.   On:  04/07/2018  20:29   Ct Abdomen Pelvis W Contrast  Result Date: 04/04/2018 CLINICAL DATA:  Productive cough for the last several months. Worsening weakness. Weight loss. EXAM: CT CHEST, ABDOMEN, AND PELVIS WITH CONTRAST TECHNIQUE: Multidetector CT imaging of the chest, abdomen and pelvis was performed following the standard protocol during bolus administration of intravenous contrast. CONTRAST:  156mL ISOVUE-300 IOPAMIDOL (ISOVUE-300) INJECTION 61% COMPARISON:  Radiography same day. FINDINGS: CT CHEST FINDINGS Cardiovascular: Heart size is normal. No pericardial fluid. There is aortic atherosclerosis. There is aneurysmal dilatation of the ascending aorta with a maximal transverse diameter of 4.4 cm. There is extensive coronary artery calcification. Mediastinum/Nodes: There is right hilar lymphadenopathy measuring up to 3 cm. No mediastinal adenopathy. Esophagus is dilated and fluid-filled. There appears to be esophageal thickening distally. The possibility of esophageal carcinoma or stoppage I today is does exist. Lungs/Pleura: There is a partially necrotic peripheral right upper lobe mass measuring up to 5 cm in diameter consistent with primary lung cancer. There is benign appearing pleural and parenchymal scarring at both lung apices the tumor has a broad surface along the pleura. At 1 point, 1 could question if there is some nodular tumor extension into the region of the parietal pleura. Patient does not have any pleural fluid however. Musculoskeletal: Ordinary degenerative changes affect the thoracic spine. No sign of metastatic disease in the thorax. CT ABDOMEN PELVIS FINDINGS Hepatobiliary: Fatty change of the liver. No focal liver lesion is seen. No calcified gallstones. Pancreas: Normal Spleen: Normal Adrenals/Urinary Tract: No adrenal mass. Kidneys are normal. Bladder is normal. Stomach/Bowel: Sigmoid diverticulosis without evidence of diverticulitis. No acute or significant bowel finding in the abdomen.  Vascular/Lymphatic: Extensive atherosclerotic disease of the abdominal aorta and its branch vessels. Infrarenal abdominal aortic aneurysm with maximal transverse diameter of 2.8 cm. 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 Maximal diameter on the right is 1.7 cm. IVC is normal. No retroperitoneal adenopathy. Reproductive: Negative Other: No free fluid or air. Musculoskeletal: Lower lumbar degenerative changes. No sign of metastatic disease in the lumbosacral spine or the pelvis. IMPRESSION: 5 cm partially necrotic mass in the posterior right upper lobe with a broad surface along the pleura. Question nodular pleural invasion. No pleural fluid however. Metastatic adenopathy in the right hilum measuring up to 3 cm. Thickening of the distal esophagus with dilated fluid filled esophagus proximal to that. This raises the possibility of a distal esophageal carcinoma. Findings could be seen with esophagitis. Aortic atherosclerosis. 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 Coronary artery calcification. Abdominal aortic atherosclerosis. Infrarenal abdominal aortic aneurysm with maximal diameter of 2.8 cm. Electronically Signed   By: Nelson Chimes M.D.   On: 04/04/2018 14:47  [2 weeks]   Assessment:  67 y/o male with necrotic mass in the posterior right upper lobe lung and malignant-appearing esophageal stenosis.  Patient with significant dysphagia, massive weight loss.   EGD this admission with malignant appearing esophageal stenosis at 40-45 CM from incisors.  Biopsies pending.  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  Due to esophageal obstruction, feeding tube cannot be placed via EGD and given significant pyloric stenosis patient needs to have surgically placed jejunostomy tube.  Per patient, he was seen by Dr. Arnoldo Morale today.   Hypernatremia per attending.  Plan: 1. F/u pending path.  2. Will  follow peripherally.   Laureen Ochs. Bernarda Caffey [facility] [phone] 6/6/20199:47 AM     LOS: 3 days

## 2018-04-08 NOTE — Discharge Instructions (Signed)
Follow with Primary MD  Patient, No Pcp Per  and other consultants as instructed your Hospitalist MD  Please get a complete blood count and chemistry panel checked by your Primary MD at your next visit, and again as instructed by your Primary MD.  Get Medicines reviewed and adjusted: Please take all your medications with you for your next visit with your Primary MD  Laboratory/radiological data: Please request your Primary MD to go over all hospital tests and procedure/radiological results at the follow up, please ask your Primary MD to get all Hospital records sent to his/her office.  In some cases, they will be blood work, cultures and biopsy results pending at the time of your discharge. Please request that your primary care M.D. follows up on these results.  Also Note the following: If you experience worsening of your admission symptoms, develop shortness of breath, life threatening emergency, suicidal or homicidal thoughts you must seek medical attention immediately by calling 911 or calling your MD immediately  if symptoms less severe.  You must read complete instructions/literature along with all the possible adverse reactions/side effects for all the Medicines you take and that have been prescribed to you. Take any new Medicines after you have completely understood and accpet all the possible adverse reactions/side effects.   Do not drive when taking Pain medications or sleeping medications (Benzodaizepines)  Do not take more than prescribed Pain, Sleep and Anxiety Medications. It is not advisable to combine anxiety,sleep and pain medications without talking with your primary care practitioner  Special Instructions: If you have smoked or chewed Tobacco  in the last 2 yrs please stop smoking, stop any regular Alcohol  and or any Recreational drug use.  Wear Seat belts while driving.  Please note: You were cared for by a hospitalist during your hospital stay. Once you are discharged,  your primary care physician will handle any further medical issues. Please note that NO REFILLS for any discharge medications will be authorized once you are discharged, as it is imperative that you return to your primary care physician (or establish a relationship with a primary care physician if you do not have one) for your post hospital discharge needs so that they can reassess your need for medications and monitor your lab values.    Full Liquid Diet A full liquid diet may be used:  To help you transition from a clear liquid diet to a soft diet.  When your body is healing and can only tolerate foods that are easy to digest.  Before or after certain a procedure, test, or surgery (such as stomach or intestinal surgeries).  If you have trouble swallowing or chewing.  A full liquid diet includes fluids and foods that are liquid or will become liquid at room temperature. The full liquid diet gives you the proteins, fluids, salts, and minerals that you need for energy. If you continue this diet for more than 72 hours, talk to your health care provider about how many calories you need to consume. If you continue the diet for more than 5 days, talk to your health care provider about taking a multivitamin or a nutritional supplement. What do I need to know about a full liquid diet?  You may have any liquid.  You may have any food that becomes a liquid at room temperature. The food is considered a liquid if it can be poured off a spoon at room temperature.  Drink one serving of citrus or vitamin C-enriched fruit juice daily.  What foods can I eat? Grains Any grain food that can be pureed in soup (such as crackers, pasta, and rice). Hot cereal (such as farina or oatmeal) that has been blended. Talk to your health care provider or dietitian about these foods. Vegetables Pulp-free tomato or vegetable juice. Vegetables pureed in soup. Fruits Fruit juice, including nectars and juices with  pulp. Meats and Other Protein Sources Eggs in custard, eggnog mix, and eggs used in ice cream or pudding. Strained meats, like in baby food, may be allowed. Consult your health care provider. Dairy Milk and milk-based beverages, including milk shakes and instant breakfast mixes. Smooth yogurt. Pureed cottage cheese. Avoid these foods if they are not well tolerated. Beverages All beverages, including liquid nutritional supplements. Ask your health care provider if you can have carbonated beverages. They may not be well tolerated. Condiments Iodized salt, pepper, spices, and flavorings. Cocoa powder. Vinegar, ketchup, yellow mustard, smooth sauces (such as hollandaise, cheese sauce, or white sauce), and soy sauce. Sweets and Desserts Custard, smooth pudding. Flavored gelatin. Tapioca, junket. Plain ice cream, sherbet, fruit ices. Frozen ice pops, frozen fudge pops, pudding pops, and other frozen bars with cream. Syrups, including chocolate syrup. Sugar, honey, jelly. Fats and Oils Margarine, butter, cream, sour cream, and oils. Other Broth and cream soups. Strained, broth-based soups. The items listed above may not be a complete list of recommended foods or beverages. Contact your dietitian for more options. What foods can I not eat? Grains All breads. Grains are not allowed unless they are pureed into soup. Vegetables Vegetables are not allowed unless they are juiced, or cooked and pureed into soup. Fruits Fruits are not allowed unless they are juiced. Meats and Other Protein Sources Any meat or fish. Cooked or raw eggs. Nut butters. Dairy Cheese. Condiments Stone ground mustards. Fats and Oils Fats that are coarse or chunky. Sweets and Desserts Ice cream or other frozen desserts that have any solids in them or on top, such as nuts, chocolate chips, and pieces of cookies. Cakes. Cookies. Candy. Others Soups with chunks or pieces in them. The items listed above may not be a complete  list of foods and beverages to avoid. Contact your dietitian for more information. This information is not intended to replace advice given to you by your health care provider. Make sure you discuss any questions you have with your health care provider. Document Released: 10/20/2005 Document Revised: 03/27/2016 Document Reviewed: 08/25/2013 Elsevier Interactive Patient Education  2017 [employer]    Dehydration, Adult Dehydration is when there is not enough fluid or water in your body. This happens when you lose more fluids than you take in. Dehydration can range from mild to very bad. It should be treated right away to keep it from getting very bad. Symptoms of mild dehydration may include:  Thirst.  Dry lips.  Slightly dry mouth.  Dry, warm skin.  Dizziness. Symptoms of moderate dehydration may include:  Very dry mouth.  Muscle cramps.  Dark pee (urine). Pee may be the color of tea.  Your body making less pee.  Your eyes making fewer tears.  Heartbeat that is uneven or faster than normal (palpitations).  Headache.  Light-headedness, especially when you stand up from sitting.  Fainting (syncope). Symptoms of very bad dehydration may include:  Changes in skin, such as: ? Cold and clammy skin. ? Blotchy (mottled) or pale skin. ? Skin that does not quickly return to normal after being lightly pinched and let go (poor skin  turgor).  Changes in body fluids, such as: ? Feeling very thirsty. ? Your eyes making fewer tears. ? Not sweating when body temperature is high, such as in hot weather. ? Your body making very little pee.  Changes in vital signs, such as: ? Weak pulse. ? Pulse that is more than 100 beats a minute when you are sitting still. ? Fast breathing. ? Low blood pressure.  Other changes, such as: ? Sunken eyes. ? Cold hands and feet. ? Confusion. ? Lack of energy (lethargy). ? Trouble waking up from sleep. ? Short-term weight  loss. ? Unconsciousness. Follow these instructions at home:  If told by your doctor, drink an ORS: ? Make an ORS by using instructions on the package. ? Start by drinking small amounts, about  cup (120 mL) every 5-10 minutes. ? Slowly drink more until you have had the amount that your doctor said to have.  Drink enough clear fluid to keep your pee clear or pale yellow. If you were told to drink an ORS, finish the ORS first, then start slowly drinking clear fluids. Drink fluids such as: ? Water. Do not drink only water by itself. Doing that can make the salt (sodium) level in your body get too low (hyponatremia). ? Ice chips. ? Fruit juice that you have added water to (diluted). ? Low-calorie sports drinks.  Avoid: ? Alcohol. ? Drinks that have a lot of sugar. These include high-calorie sports drinks, fruit juice that does not have water added, and soda. ? Caffeine. ? Foods that are greasy or have a lot of fat or sugar.  Take over-the-counter and prescription medicines only as told by your doctor.  Do not take salt tablets. Doing that can make the salt level in your body get too high (hypernatremia).  Eat foods that have minerals (electrolytes). Examples include bananas, oranges, potatoes, tomatoes, and spinach.  Keep all follow-up visits as told by your doctor. This is important. Contact a doctor if:  You have belly (abdominal) pain that: ? Gets worse. ? Stays in one area (localizes).  You have a rash.  You have a stiff neck.  You get angry or annoyed more easily than normal (irritability).  You are more sleepy than normal.  You have a harder time waking up than normal.  You feel: ? Weak. ? Dizzy. ? Very thirsty.  You have peed (urinated) only a small amount of very dark pee during 6-8 hours. Get help right away if:  You have symptoms of very bad dehydration.  You cannot drink fluids without throwing up (vomiting).  Your symptoms get worse with  treatment.  You have a fever.  You have a very bad headache.  You are throwing up or having watery poop (diarrhea) and it: ? Gets worse. ? Does not go away.  You have blood or something green (bile) in your throw-up.  You have blood in your poop (stool). This may cause poop to look black and tarry.  You have not peed in 6-8 hours.  You pass out (faint).  Your heart rate when you are sitting still is more than 100 beats a minute.  You have trouble breathing. This information is not intended to replace advice given to you by your health care provider. Make sure you discuss any questions you have with your health care provider. Document Released: 08/16/2009 Document Revised: 05/09/2016 Document Reviewed: 12/14/2015 Elsevier Interactive Patient Education  2018 [employer].  Esophageal Cancer The esophagus is the tube that carries  food and liquid from your throat to your stomach. Esophageal cancer is an abnormal growth of cells in the esophagus that is cancerous (malignant). What are the causes? The exact cause of esophageal cancer is not known. What increases the risk? Risk factors for esophageal cancer include:  Being older than 48.  Being male.  Using any tobacco product, including cigarettes, chewing tobacco, and electronic cigarettes.  Drinking excessive amounts of alcohol.  Eating a diet that is lacking in fruits and vegetables.  Being overweight (obese).  Having damage to your esophagus due to toxin or poison exposure.  Having conditions that cause damage or irritation to the esophagus. These conditions include: ? Acid reflux. ? Barrett esophagus. ? Achalasia.  What are the signs or symptoms? Symptoms may include:  Trouble swallowing.  Chest or back pain.  Unintentional weight loss.  Fatigue.  Hoarse voice.  Coughing. This may include coughing up blood.  Vomiting. This may include vomiting up blood.  How is this diagnosed? Your health care  provider may ask about your medical history and perform a physical exam. Other tests that may be done include:  Barium swallow.  Endoscopic exam.  X-rays.  CT scan.  MRI.  Esophagography.  A tissue sample test (biopsy).  If esophageal cancer is confirmed, it will be staged to determine its severity and extent. Staging is an assessment of:  The size of the tumor.  Whether or not the cancer has spread.  Where the cancer has spread.  How is this treated? Treatment for esophageal cancer depends on the type and stage of the cancer. Treatment may include one or more of the following:  Surgery to remove as much of the cancer as possible.  Chemotherapy. This treatment uses medicines to kill the cancer cells.  Radiation therapy to kill cancer cells.  Targeted drug therapy. These medicines block the growth and spread of cancer. This treatment is different from standard chemotherapy.  Immunotherapy. This is also called biological therapy. It is an emerging treatment that strengthens your bodys defense (immune) system to fight the cancer cells.  Follow these instructions at home:  Take medicines only as directed by your health care provider.  Maintain a healthy diet.  Consider joining a support group.  Seek advice to help you manage side effects of treatment.  Keep all follow-up visits as directed by your health care provider. This is important.  Do not use any tobacco products, including cigarettes, chewing tobacco, or electronic cigarettes. If you need help quitting, ask your health care provider.  Talk with your health care provider about limiting or avoiding alcohol. Contact a health care provider if:  You are having a harder time swallowing or eating.  You notice new fatigue or weakness.  You lose weight unintentionally.  You have a fever. Get help right away if:  You have a sudden increase in pain.  You have trouble breathing.  You vomit blood or black  material that looks like coffee grounds.  You have black stools.  You faint. This information is not intended to replace advice given to you by your health care provider. Make sure you discuss any questions you have with your health care provider. Document Released: 10/02/2008 Document Revised: 03/27/2016 Document Reviewed: 05/23/2014 Elsevier Interactive Patient Education  [employer].

## 2018-04-08 NOTE — Discharge Summary (Addendum)
Physician Discharge Summary  Joshua Schwartz HER:[id number] DOB: [birth year] DOA: 04/05/2018  PCP: Patient, No Pcp Per Surgeon: Dr. Arnoldo Morale Oncologist: Dr. Delton Coombes GI: Dr. Oneida Alar  Admit date: 04/05/2018 Discharge date: 04/08/2018  Admitted From: Home  Disposition: Home  Diet: Full Liquid Diet  Recommendations for Outpatient Follow-up:  1. Follow up with oncologist in 1 weeks to discuss biopsy results 2. Follow up with general surgery in 1-2 weeks 3. Please go to have CT biopsy of lung lesion as scheduled in 1 week 4. Please arrange for outpatient PET scanning per radiology request 5. Please follow up on the following pending results: biopsy results  Discharge Condition: STABLE   ----> Pt is HIGH RISK for readmission  CODE STATUS: FULL    Brief Hospitalization Summary: Please see all hospital notes, images, labs for full details of the hospitalization.  HPI: Joshua Schwartz is a 67 y.o. male with medical history significant for significant tobacco abuse and GERD who presented to the ED yesterday with complaints of nausea and vomiting along with unintentional weight loss as well as poor appetite and cough.  He had multiple imaging studies done with CT chest as well as abdomen and pelvis demonstrating round right upper lobe 6 cm mass as well as possible malignancy with mediastinal nodes with concern of distal esophagitis.  There was also noted aneurysm of the ascending and descending aorta.  He was being set up for outpatient CT-guided biopsy and was sent home, but unfortunately continues to have difficulty with oral intake of both solids and liquids and has a persistent burning sensation to his substernal chest and epigastric region.  He has not been able to keep anything down and therefore returns to the ED.   ED Course: Vital signs are stable, but patient has elevated blood pressure readings of 856 systolic and over 314 diastolic.  Laboratory data unremarkable aside from mild sodium elevation of  148 and BUN 33.  Creatinine appears to be near baseline at 1.14 with prior 1.05.  Glucose is 106.  Imaging studies were reviewed with findings of right upper lobe mass as well as thickening of distal esophagus and aortic aneurysm is noted. Patient has been given 1 L normal saline bolus.  Assessment & Plan:   Principal Problem: Intractable nausea and vomiting Active Problems: Hypertension   Lung Mass   Ascending aortic aneurysm   Tobacco abuse  1. Intractable nausea and vomiting with dysphagia - He is tolerating full liquid diet.  Prelim pathology suggesting esophageal cancer based on biopsy taken during EGD. He is tolerating a full liquid diet.  He would like to go home and follow up outpatient for further work up of his cancer.  He has been seen by oncologist and they will see him next week to discuss results of biopsy testing.  He will need CT biopsy of RUL.  I placed order at discharge for CT biopsy as future order to hopefully have done in next 7-10 days.  Radiology will have to review and decide if they will schedule procedure.  Pt also will follow up with Dr. Arnoldo Morale in next couple of weeks to discuss/arrange placement of jejunostomy tube.   CT of chest shows malignant appearing stenosis of the distal esophagus extending from 40 cm to 45 cm from incisors, highly suspicious for a second primary, pathology report suggesting esophageal cancer. Consulted with oncology Dr. Delton Coombes and he will see patient early next week to discuss next steps.  Pt verbalizes understanding of this.  He  was instructed to remain on full liquid diet and he verbalized understanding of these instructions.   2. Hypertension. stable.  Follow up further outpatient.   3. Lung mass.Spoke with IR Dr. Anselm Pancoast who recommends PET-CT once discharged prior to biopsy.  This has to be done as outpatient.  CT biopsy ordered for patient as noted above.  Radiology will contact the patient with the information for procedure once  it has been approved by the radiologist.  4. Ascending aortic aneurysm and infrarenal abdominal aortic aneurysm. Will need close surveillance and follow-up. 5. Tobacco abuse. Pt strongly advised to discontinue all cigarette smoking and tobacco products.  Nicotine patches are ok to use. Counseled on cessation.  DVT prophylaxis: SCDs Code Status: FULL CODE Family Communication: Patient Disposition Plan: Discharge home  Consultants:  Neurology Dr. Delton Coombes; he states benign stricture at the pylorus. Recommends patient to have a jejunostomy tube for nutrition.   GI with Dr. Oneida Alar: recommends G-tube placement for nutrition. Endoscopy shows stenosis esophagus. Dilation of esophagus was attempted. Post dilation patient able to tolerate clear liquid diet.   Procedures:  Endoscopy with Dr. Oneida Alar.   Oncology recommendations 04/07/18  PLAN: 1.  Malignant stenosis of the distal esophagus: He had a EGD and biopsy on 04/06/2018.  Results are pending at this time.  He is able to tolerate liquids at this time.  He has a benign stricture at the pylorus.  Hence it was recommended for him to have a jejunostomy tube for nutrition.  Dr. Arnoldo Morale will see him later today.  Should the patient be discharged after the G-tube placement, we will see him early next week to discuss the results.  2.  Right lung mass with hilar adenopathy: I have discussed the findings on the CT scan with the patient and his friend in detail.  This is highly likely a second primary.  I have recommended CT-guided biopsy of the right upper lobe lung lesion.  Again we can discuss the results of this biopsy early next week in our office.  I would recommend doing an MRI of the brain with and without gadolinium for lung cancer staging work-up as well as him complaining of headaches which are new onset.  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  Ideally, would finish work-up of the masses and then will place jejunostomy tube as an outpatient as this will require a 3 to 4-day admission process.  This has been discussed with the nutritionist.  After patient has been seen by Dr. Delton Coombes next week, please call my office and we will arrange for the surgery.  The risks and benefits of the procedure including bleeding, infection, and now position of the jejunostomy tube were fully explained to the patient, who gave informed consent.  He also realizes a Port-A-Cath may be placed at the same time.  Avian Konigsberg 04/08/2018, 11:00 AM    Discharge Diagnoses:  Principal Problem:   Intractable nausea and vomiting Active Problems:   Tobacco abuse   GERD (gastroesophageal reflux disease)   Weight loss, abnormal   Aortic aneurysm (HCC)   Dysphagia   Food impaction of esophagus   Protein-calorie malnutrition, severe   Esophageal stricture   Lung mass  Discharge Instructions: Discharge Instructions    Call MD for:  difficulty breathing, headache or visual disturbances   Complete by:  As directed    Call MD for:  extreme fatigue   Complete by:  As directed    Call MD for:  persistant dizziness or light-headedness   Complete by:  As directed    Call MD for:  persistant nausea and vomiting   Complete by:  As directed    Call MD for:  severe uncontrolled pain   Complete by:  As directed    Increase activity slowly   Complete by:  As directed      Allergies as of 04/08/2018      Reactions   Codeine    Altered mental status      Medication List    STOP taking these medications   ibuprofen 200 MG tablet Commonly known as:  ADVIL,MOTRIN     TAKE these medications   EXCEDRIN PO Take 1 tablet by mouth every 6 (six) hours.   feeding supplement (ENSURE ENLIVE)  Liqd Take 237 mLs by mouth 4 (four) times daily.   feeding supplement (PRO-STAT SUGAR FREE 64) Liqd Take 30 mLs by mouth 2 (two) times daily.   nicotine 14 mg/24hr patch Commonly known as:  NICODERM CQ - dosed in mg/24 hours Place 1 patch (14 mg total) onto the skin daily. Start taking on:  04/09/2018   omeprazole 20 MG capsule Commonly known as:  PRILOSEC Take 1 capsule (20 mg total) by mouth 2 (two) times daily.      Follow-up Information    Aviva Signs, MD. Schedule an appointment as soon as possible for a visit in 2 week(s).   Specialty:  General Surgery Contact information: [address] [place] [address] [phone]        Joshua Jack, MD. Schedule an appointment as soon as possible for a visit in 1 week(s).   Specialty:  Hematology Why:  Hospital Follow Up  Contact information: [place] [address] [phone]          Allergies  Allergen Reactions  . Codeine     Altered mental status   Allergies as of 04/08/2018      Reactions   Codeine    Altered mental status      Medication List    STOP taking these medications   ibuprofen 200 MG tablet Commonly known as:  ADVIL,MOTRIN     TAKE these medications   EXCEDRIN PO Take 1 tablet by mouth every 6 (six) hours.   feeding supplement (ENSURE ENLIVE) Liqd Take 237 mLs by mouth 4 (four) times daily.   feeding supplement (PRO-STAT SUGAR FREE 64) Liqd Take 30 mLs by mouth 2 (two) times daily.   nicotine 14 mg/24hr patch Commonly known as:  NICODERM CQ - dosed in mg/24 hours Place 1 patch (14 mg total) onto the skin daily. Start taking on:  04/09/2018   omeprazole 20 MG capsule Commonly known as:  PRILOSEC Take 1 capsule (20 mg total) by mouth 2 (two) times daily.       Procedures/Studies: Dg Chest 2 View  Result Date: 04/04/2018 CLINICAL DATA:  Cough, weight loss. EXAM: CHEST - 2 VIEW COMPARISON:  None. FINDINGS: 6 cm mass within the RIGHT upper lobe. LEFT  lung is clear. Prominence of the RIGHT hilum could be due to associated lymphadenopathy or ectasia of the ascending thoracic aorta. Heart size is normal. No acute or suspicious osseous finding. IMPRESSION: RIGHT upper lobe mass measuring approximately 6 cm greatest dimension. This is almost certainly a lung cancer. Recommend chest CT for further  characterization and to exclude associated mediastinal and/or perihilar lymphadenopathy. Electronically Signed   By: Franki Cabot M.D.   On: 04/04/2018 13:25   Ct Chest W Contrast  Result Date: 04/04/2018 CLINICAL DATA:  Productive cough for the last several months. Worsening weakness. Weight loss. EXAM: CT CHEST, ABDOMEN, AND PELVIS WITH CONTRAST TECHNIQUE: Multidetector CT imaging of the chest, abdomen and pelvis was performed following the standard protocol during bolus administration of intravenous contrast. CONTRAST:  15mL ISOVUE-300 IOPAMIDOL (ISOVUE-300) INJECTION 61% COMPARISON:  Radiography same day. FINDINGS: CT CHEST FINDINGS Cardiovascular: Heart size is normal. No pericardial fluid. There is aortic atherosclerosis. There is aneurysmal dilatation of the ascending aorta with a maximal transverse diameter of 4.4 cm. There is extensive coronary artery calcification. Mediastinum/Nodes: There is right hilar lymphadenopathy measuring up to 3 cm. No mediastinal adenopathy. Esophagus is dilated and fluid-filled. There appears to be esophageal thickening distally. The possibility of esophageal carcinoma or stoppage I today is does exist. Lungs/Pleura: There is a partially necrotic peripheral right upper lobe mass measuring up to 5 cm in diameter consistent with primary lung cancer. There is benign appearing pleural and parenchymal scarring at both lung apices the tumor has a broad surface along the pleura. At 1 point, 1 could question if there is some nodular tumor extension into the region of the parietal pleura. Patient does not have any pleural fluid however.  Musculoskeletal: Ordinary degenerative changes affect the thoracic spine. No sign of metastatic disease in the thorax. CT ABDOMEN PELVIS FINDINGS Hepatobiliary: Fatty change of the liver. No focal liver lesion is seen. No calcified gallstones. Pancreas: Normal Spleen: Normal Adrenals/Urinary Tract: No adrenal mass. Kidneys are normal. Bladder is normal. Stomach/Bowel: Sigmoid diverticulosis without evidence of diverticulitis. No acute or significant bowel finding in the abdomen. Vascular/Lymphatic: Extensive atherosclerotic disease of the abdominal aorta and its branch vessels. Infrarenal abdominal aortic aneurysm with maximal transverse diameter of 2.8 cm. 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 Maximal diameter on the right is 1.7 cm. IVC is normal. No retroperitoneal adenopathy. Reproductive: Negative Other: No free fluid or air. Musculoskeletal: Lower lumbar degenerative changes. No sign of metastatic disease in the lumbosacral spine or the pelvis. IMPRESSION: 5 cm partially necrotic mass in the posterior right upper lobe with a broad surface along the pleura. Question nodular pleural invasion. No pleural fluid however. Metastatic adenopathy in the right hilum measuring up to 3 cm. Thickening of the distal esophagus with dilated fluid filled esophagus proximal to that. This raises the possibility of a distal esophageal carcinoma. Findings could be seen with esophagitis. Aortic atherosclerosis. 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 Coronary artery calcification. Abdominal aortic atherosclerosis. Infrarenal abdominal aortic aneurysm with maximal diameter of 2.8 cm. Electronically Signed   By: Nelson Chimes M.D.   On: 04/04/2018 14:47   Mr Jeri Cos YN Contrast  Result Date: 04/07/2018 CLINICAL DATA:  Initial evaluation for lung cancer staging. EXAM: MRI HEAD WITHOUT AND WITH CONTRAST TECHNIQUE: Multiplanar, multiecho pulse sequences of the brain and  surrounding structures were obtained without and with intravenous contrast. CONTRAST:  14mL MULTIHANCE GADOBENATE DIMEGLUMINE 529 MG/ML IV SOLN COMPARISON:  Prior CT from 09/09/2007. FINDINGS: Brain: Generalized age-related cerebral atrophy. Mild T2/FLAIR hyperintensity within the periventricular and deep white matter both cerebral hemispheres, most consistent with chronic small vessel ischemic change, mild for age. No evidence for acute or subacute infarct. Gray-white matter differentiation maintained. Small remote right cerebellar infarct. No other areas of remote cortical infarction.  No acute intracranial hemorrhage. Subcentimeter focus of susceptibility artifact within the central pons, consistent with a small chronic microhemorrhage, of doubtful significance in isolation. No mass lesion, midline shift, or mass effect. No hydrocephalus. No extra-axial fluid collection. No abnormal enhancement. No evidence for intracranial metastatic disease. Pituitary within normal limits. Vascular: Major intracranial vascular flow voids maintained left vertebral artery hypoplastic and likely terminates in PICA. Skull and upper cervical spine: Craniocervical junction within normal limits. Upper cervical spine normal. Single 5 mm T2 hyperintense enhancing lesion at the right frontal calvarium (series 12, image 62), indeterminate. No other discrete osseous lesion. Scalp soft tissues unremarkable. Sinuses/Orbits: Globes and orbital soft tissues within normal limits. Left maxillary sinus retention cyst. Paranasal sinuses are otherwise clear. Left mastoid effusion, likely sterile/benign. Inner ear structures normal. Other: None. IMPRESSION: 1. No evidence for intracranial metastatic disease. 2. Single 5 mm, indeterminate, but favored to reflect a benign etiology. Correlation with bone scan suggested for further evaluation. At the right frontal calvarium 3. Age-related cerebral atrophy with mild chronic small vessel ischemic disease,  with small remote right cerebellar infarct. Electronically Signed   By: Jeannine Boga M.D.   On: 04/07/2018 20:29   Ct Abdomen Pelvis W Contrast  Result Date: 04/04/2018 CLINICAL DATA:  Productive cough for the last several months. Worsening weakness. Weight loss. EXAM: CT CHEST, ABDOMEN, AND PELVIS WITH CONTRAST TECHNIQUE: Multidetector CT imaging of the chest, abdomen and pelvis was performed following the standard protocol during bolus administration of intravenous contrast. CONTRAST:  136mL ISOVUE-300 IOPAMIDOL (ISOVUE-300) INJECTION 61% COMPARISON:  Radiography same day. FINDINGS: CT CHEST FINDINGS Cardiovascular: Heart size is normal. No pericardial fluid. There is aortic atherosclerosis. There is aneurysmal dilatation of the ascending aorta with a maximal transverse diameter of 4.4 cm. There is extensive coronary artery calcification. Mediastinum/Nodes: There is right hilar lymphadenopathy measuring up to 3 cm. No mediastinal adenopathy. Esophagus is dilated and fluid-filled. There appears to be esophageal thickening distally. The possibility of esophageal carcinoma or stoppage I today is does exist. Lungs/Pleura: There is a partially necrotic peripheral right upper lobe mass measuring up to 5 cm in diameter consistent with primary lung cancer. There is benign appearing pleural and parenchymal scarring at both lung apices the tumor has a broad surface along the pleura. At 1 point, 1 could question if there is some nodular tumor extension into the region of the parietal pleura. Patient does not have any pleural fluid however. Musculoskeletal: Ordinary degenerative changes affect the thoracic spine. No sign of metastatic disease in the thorax. CT ABDOMEN PELVIS FINDINGS Hepatobiliary: Fatty change of the liver. No focal liver lesion is seen. No calcified gallstones. Pancreas: Normal Spleen: Normal Adrenals/Urinary Tract: No adrenal mass. Kidneys are normal. Bladder is normal. Stomach/Bowel: Sigmoid  diverticulosis without evidence of diverticulitis. No acute or significant bowel finding in the abdomen. Vascular/Lymphatic: Extensive atherosclerotic disease of the abdominal aorta and its branch vessels. Infrarenal abdominal aortic aneurysm with maximal transverse diameter of 2.8 cm. 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 Maximal diameter on the right is 1.7 cm. IVC is normal. No retroperitoneal adenopathy. Reproductive: Negative Other: No free fluid or air. Musculoskeletal: Lower lumbar degenerative changes. No sign of metastatic disease in the lumbosacral spine or the pelvis. IMPRESSION: 5 cm partially necrotic mass in the posterior right upper lobe with a broad surface along the pleura. Question nodular pleural invasion. No pleural fluid however. Metastatic adenopathy in the right hilum measuring up to 3 cm. Thickening of the distal  esophagus with dilated fluid filled esophagus proximal to that. This raises the possibility of a distal esophageal carcinoma. Findings could be seen with esophagitis. Aortic atherosclerosis. 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 Coronary artery calcification. Abdominal aortic atherosclerosis. Infrarenal abdominal aortic aneurysm with maximal diameter of 2.8 cm. Electronically Signed   By: Nelson Chimes M.D.   On: 04/04/2018 14:47      Subjective:   Discharge Exam: Vitals:   04/08/18 0617 04/08/18 1021  BP: (!) 143/99 (!) 160/81  Pulse: 67 68  Resp:    Temp: 98.3 F (36.8 C) 98.3 F (36.8 C)  SpO2: 99% 100%   Vitals:   04/07/18 2041 04/07/18 2051 04/08/18 0617 04/08/18 1021  BP: 136/87  (!) 143/99 (!) 160/81  Pulse: (!) 58  67 68  Resp:      Temp: 97.8 F (36.6 C)  98.3 F (36.8 C) 98.3 F (36.8 C)  TempSrc: Oral  Oral Oral  SpO2: 98% 95% 99% 100%  Weight:      Height:        General: Pt is alert, awake, not in acute distress Cardiovascular: RRR, S1/S2 +, no rubs, no gallops Respiratory:  CTA bilaterally, no wheezing, no rhonchi Abdominal: Soft, NT, ND, bowel sounds + Extremities: no edema, no cyanosis   The results of significant diagnostics from this hospitalization (including imaging, microbiology, ancillary and laboratory) are listed below for reference.     Microbiology: No results found for this or any previous visit (from the past 240 hour(s)).   Labs: BNP (last 3 results) No results for input(s): BNP in the last 8760 hours. Basic Metabolic Panel: Recent Labs  Lab 04/04/18 1245 04/05/18 1513 04/06/18 0531  NA 145 148* 150*  K 4.3 4.4 4.0  CL 104 107 113*  CO2 28 26 27   GLUCOSE 115* 106* 105*  BUN 26* 33* 30*  CREATININE 1.05 1.14 0.86  CALCIUM 10.4* 10.4* 9.7   Liver Function Tests: Recent Labs  Lab 04/04/18 1245 04/05/18 1513 04/06/18 0531  AST 19 28 32  ALT 23 28 33  ALKPHOS 84 87 76  BILITOT 1.3* 1.2 1.1  PROT 7.7 7.4 6.5  ALBUMIN 4.0 4.1 3.5   Recent Labs  Lab 04/04/18 1245  LIPASE 32   No results for input(s): AMMONIA in the last 168 hours. CBC: Recent Labs  Lab 04/04/18 1245 04/05/18 1513 04/06/18 0531  WBC 9.2 10.1 8.9  NEUTROABS  --  8.4*  --   HGB 16.7 15.7 14.0  HCT 50.7 48.4 44.9  MCV 97.1 98.0 99.8  PLT 228 233 217   Cardiac Enzymes: No results for input(s): CKTOTAL, CKMB, CKMBINDEX, TROPONINI in the last 168 hours. BNP: Invalid input(s): POCBNP CBG: No results for input(s): GLUCAP in the last 168 hours. D-Dimer No results for input(s): DDIMER in the last 72 hours. Hgb A1c No results for input(s): HGBA1C in the last 72 hours. Lipid Profile No results for input(s): CHOL, HDL, LDLCALC, TRIG, CHOLHDL, LDLDIRECT in the last 72 hours. Thyroid function studies No results for input(s): TSH, T4TOTAL, T3FREE, THYROIDAB in the last 72 hours.  Invalid input(s): FREET3 Anemia work up No results for input(s): VITAMINB12, FOLATE, FERRITIN, TIBC, IRON, RETICCTPCT in the last 72 hours. Urinalysis    Component Value  Date/Time   COLORURINE AMBER (A) 04/04/2018 1400   APPEARANCEUR HAZY (A) 04/04/2018 1400   LABSPEC 1.026 04/04/2018 1400   PHURINE 5.0 04/04/2018 1400   GLUCOSEU NEGATIVE 04/04/2018 1400   HGBUR NEGATIVE  04/04/2018 1400   BILIRUBINUR NEGATIVE 04/04/2018 1400   KETONESUR 20 (A) 04/04/2018 1400   PROTEINUR 30 (A) 04/04/2018 1400   NITRITE NEGATIVE 04/04/2018 1400   LEUKOCYTESUR NEGATIVE 04/04/2018 1400   Sepsis Labs Invalid input(s): PROCALCITONIN,  WBC,  LACTICIDVEN Microbiology No results found for this or any previous visit (from the past 240 hour(s)).  Time coordinating discharge: 43 minutes  SIGNED:  Irwin Brakeman, MD  [facility] 04/08/2018, 1:34 PM Pager [phone]  If 7PM-7AM, please contact night-coverage [email or web address] Password [email or web address]

## 2018-04-08 NOTE — Consult Note (Signed)
Reason for Consult: Need for jejunostomy tube placement Referring Physician: Drs. Fields, Delton Coombes, Johnson  Joshua Schwartz is an 67 y.o. male.  HPI: Patient is a 67 year old white male who presented to [facility] with a significant weight loss and epigastric pain.  Patient has had extensive work-up which shows a right lung mass, gastroesophageal mass, and pyloric stenosis.  No pathologic diagnosis has been obtained yet, though there is the concern that he has 2 primary malignancies.  Patient states that he is able to keep liquids down and has been started on liquid supplementation.  He is unable to eat solid foods at this time.  He currently has no abdominal pain.  Past Medical History:  Diagnosis Date  . Esophageal mass   . GERD (gastroesophageal reflux disease)    denies chronic history  . HOH (hard of hearing)   . Hypertension     Past Surgical History:  Procedure Laterality Date  . ESOPHAGOGASTRODUODENOSCOPY N/A 04/06/2018   Procedure: ESOPHAGOGASTRODUODENOSCOPY (EGD);  Surgeon: Danie Binder, MD;  Location: [facility];  Service: Endoscopy;  Laterality: N/A;  possible dilation   . EYE SURGERY     as a child: artifical tear ducts    Family History  Problem Relation Age of Onset  . Colon cancer Neg Hx   . Colon polyps Neg Hx     Social History:  reports that he quit smoking about 2 years ago. He quit after 45.00 years of use. He has never used smokeless tobacco. He reports that he drank alcohol. He reports that he does not use drugs.  Allergies:  Allergies  Allergen Reactions  . Codeine     Altered mental status    Medications: I have reviewed the patient's current medications.  No results found for this or any previous visit (from the past 48 hour(s)).  Joshua Schwartz  Result Date: 04/07/2018 CLINICAL DATA:  Initial evaluation for lung cancer staging. EXAM: MRI HEAD WITHOUT AND WITH Schwartz TECHNIQUE: Multiplanar, multiecho pulse sequences of the  brain and surrounding structures were obtained without and with intravenous Schwartz. Schwartz:  35mL MULTIHANCE GADOBENATE DIMEGLUMINE 529 MG/ML IV SOLN COMPARISON:  Prior CT from 09/09/2007. FINDINGS: Brain: Generalized age-related cerebral atrophy. Mild T2/FLAIR hyperintensity within the periventricular and deep white matter both cerebral hemispheres, most consistent with chronic small vessel ischemic change, mild for age. No evidence for acute or subacute infarct. Gray-white matter differentiation maintained. Small remote right cerebellar infarct. No other areas of remote cortical infarction. No acute intracranial hemorrhage. Subcentimeter focus of susceptibility artifact within the central pons, consistent with a small chronic microhemorrhage, of doubtful significance in isolation. No mass lesion, midline shift, or mass effect. No hydrocephalus. No extra-axial fluid collection. No abnormal enhancement. No evidence for intracranial metastatic disease. Pituitary within normal limits. Vascular: Major intracranial vascular flow voids maintained left vertebral artery hypoplastic and likely terminates in PICA. Skull and upper cervical spine: Craniocervical junction within normal limits. Upper cervical spine normal. Single 5 mm T2 hyperintense enhancing lesion at the right frontal calvarium (series 12, image 62), indeterminate. No other discrete osseous lesion. Scalp soft tissues unremarkable. Sinuses/Orbits: Globes and orbital soft tissues within normal limits. Left maxillary sinus retention cyst. Paranasal sinuses are otherwise clear. Left mastoid effusion, likely sterile/benign. Inner ear structures normal. Other: None. IMPRESSION: 1. No evidence for intracranial metastatic disease. 2. Single 5 mm, indeterminate, but favored to reflect a benign etiology. Correlation with bone scan suggested for further evaluation. At the right frontal  calvarium 3. Age-related cerebral atrophy with mild chronic small vessel ischemic  disease, with small remote right cerebellar infarct. Electronically Signed   By: Jeannine Boga M.D.   On: 04/07/2018 20:29    ROS:  Pertinent items are noted in HPI.  Blood pressure (!) 160/81, pulse 68, temperature 98.3 F (36.8 C), temperature source Oral, resp. rate 18, height 5\' 7"  (1.702 m), weight 97 lb (44 kg), SpO2 100 %. Physical Exam: Cachectic white male in no acute distress Head is normocephalic, atraumatic Lungs clear to auscultation with equal breath sounds bilaterally Heart examination reveals regular rate and rhythm without S3, S4, murmurs Abdomen is soft and flat.  No hepatosplenomegaly or masses are noted.  Notes from Dr. Lajean Silvius, and hospitalist all reviewed. 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  Ideally, would finish work-up of the masses and then will place jejunostomy tube as an outpatient as this will require a 3 to 4-day admission process.  This has been discussed with the nutritionist.  After patient has been seen by Dr. Delton Coombes next week, please call my office and we will arrange for the surgery.  The risks and benefits of the procedure including bleeding, infection, and now position of the jejunostomy tube were fully explained to the patient, who gave informed consent.  He also realizes a Port-A-Cath may be placed at the same time.  Joshua Schwartz 04/08/2018, 11:00 AM

## 2018-04-08 NOTE — Progress Notes (Signed)
Nutrition Brief Follow Up Note  RD was notified by surgeon yesterday regarding plan for jejunostomy. Unable to place PEG due to severe pyloric stenosis.   RD briefly met with patient to introduce self and review nutrition plan. He has been instructed on Full liquid diet. He is seen with large number of nutrition supplements at bedside, apparently brought in by family member.  RD also provided coupons for Ensure and Boost. Briefly explained how a jejunostomy feeding would work; he would need to be fed via pump. Given that he can still take full liquids, he would likely only require a nocturnal low rate feed to meet kcal/pro/fluid needs.  Pt has no questions at this time. RD will monitor outpatient.   Burtis Junes RD, LDN, CNSC Clinical Nutrition Available Tues-Sat via Pager: [phone] 04/08/2018 4:14 PM

## 2018-04-12 NOTE — ED Notes (Signed)
Attempted to follow up regarding pt's outpatient CT biopsy.  Vivien Rota at central scheduling states the physician did not approve this and needs to speak with the ordering physician.  Attempted to contact Dr. Anselm Pancoast with no answer.

## 2018-04-13 ENCOUNTER — Other Ambulatory Visit (HOSPITAL_COMMUNITY): Payer: Self-pay | Admitting: Hematology

## 2018-04-13 DIAGNOSIS — R918 Other nonspecific abnormal finding of lung field: Secondary | ICD-10-CM

## 2018-04-16 ENCOUNTER — Ambulatory Visit (HOSPITAL_COMMUNITY): Payer: Medicare Other | Admitting: Hematology

## 2018-04-19 ENCOUNTER — Ambulatory Visit (HOSPITAL_COMMUNITY)
Admission: RE | Admit: 2018-04-19 | Discharge: 2018-04-19 | Disposition: A | Discharge status: Home / Self Care | Payer: Medicare Other | Source: Ambulatory Visit | Attending: Hematology | Admitting: Hematology

## 2018-04-19 DIAGNOSIS — I712 Thoracic aortic aneurysm, without rupture: Secondary | ICD-10-CM | POA: Diagnosis not present

## 2018-04-19 DIAGNOSIS — I700 Atherosclerosis of aorta: Secondary | ICD-10-CM | POA: Diagnosis not present

## 2018-04-19 DIAGNOSIS — R933 Abnormal findings on diagnostic imaging of other parts of digestive tract: Secondary | ICD-10-CM | POA: Insufficient documentation

## 2018-04-19 DIAGNOSIS — R918 Other nonspecific abnormal finding of lung field: Secondary | ICD-10-CM | POA: Diagnosis present

## 2018-04-19 LAB — GLUCOSE, CAPILLARY: GLUCOSE-CAPILLARY: 94 mg/dL (ref 65–99)

## 2018-04-19 MED ORDER — FLUDEOXYGLUCOSE F - 18 (FDG) INJECTION
4.9800 | Freq: Once | INTRAVENOUS | Status: AC | PRN
  Administered 2018-04-19: 4.98 via INTRAVENOUS

## 2018-04-21 ENCOUNTER — Other Ambulatory Visit: Payer: Self-pay

## 2018-04-21 ENCOUNTER — Inpatient Hospital Stay (HOSPITAL_COMMUNITY): Payer: Medicare Other | Attending: Hematology | Admitting: Hematology

## 2018-04-21 ENCOUNTER — Encounter (HOSPITAL_COMMUNITY): Payer: Self-pay | Admitting: Hematology

## 2018-04-21 VITALS — BP 129/90 | HR 68 | Temp 97.6°F | Resp 16 | Ht 67.0 in | Wt 104.2 lb

## 2018-04-21 DIAGNOSIS — K311 Adult hypertrophic pyloric stenosis: Secondary | ICD-10-CM

## 2018-04-21 DIAGNOSIS — R918 Other nonspecific abnormal finding of lung field: Secondary | ICD-10-CM | POA: Diagnosis not present

## 2018-04-21 DIAGNOSIS — R590 Localized enlarged lymph nodes: Secondary | ICD-10-CM | POA: Diagnosis not present

## 2018-04-21 DIAGNOSIS — C155 Malignant neoplasm of lower third of esophagus: Secondary | ICD-10-CM

## 2018-04-21 DIAGNOSIS — C159 Malignant neoplasm of esophagus, unspecified: Secondary | ICD-10-CM

## 2018-04-21 NOTE — Assessment & Plan Note (Signed)
1.  Locoregional esophageal adenocarcinoma: - Presentation with unable to eat and 40 pound weight loss, CT scan CAP on 04/04/2018 showing right upper lobe mass with right hilar adenopathy, thickening of the distal esophagus. - EGD on 04/06/2018 showing malignant appearing stenosis of the distal esophagus extending from 40 cm to 45 cm, stenosis measuring 9 mm, biopsy consistent with adenocarcinoma.  There is also stenosis of the pylorus. - I discussed the results of the PET CT scan dated 04/20/2018 which shows right upper lobe lung mass, right hilar adenopathy, multiple groundglass opacities in both lungs with increased FDG uptake, hypermetabolic GE junction cancer.  MRI of the brain was negative for parenchymal metastatic disease. - Patient is reluctant to consider biopsy of the right lung mass.  I have that it is very important for [place] to have this diagnosis to plan future treatment of both of his cancers.  He finally agreed for the biopsy.  We will set it up by interventional radiology. 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  He has an appointment to see Dr. Arnoldo Morale tomorrow.  However he wants to delay it by couple of weeks as he is getting new hearing aids tomorrow. -I will see him back in 1 week after the biopsy to discuss results and treatment plan.

## 2018-04-21 NOTE — Progress Notes (Signed)
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   HISTORY OF PRESENT ILLNESS:  Mr. 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 He came into the hospital on 04/05/2018 unable to eat and lost about 40+ pounds in the last 2 months.  He lived in an apartment by himself prior to this admission.  He smoked about 53 years and quit 3 and half years ago.  He vapes now.  A CT scan of the chest, abdomen and pelvis on 04/04/2018 showed a 5 cm partially necrotic mass in the posterior right upper lobe with metastatic adenopathy in the right hilum measuring up to 3 cm.  There was also thickening of the distal esophagus with dilated fluid-filled esophagus proximal to that.  He was admitted to the hospital and underwent EGD on 04/06/2018 which showed malignant appearing stenosis of the distal esophagus extending from 40 cm to 45 cm from the incisors.  Stenosis measured 9 mm.  This was dilated and was biopsied.  Food was found in the distal esophagus which was removed.  A pediatric gastroscope was able to reach up to pylorus which showed benign appearing intrinsic moderate stenosis.  This was unable to be dilated.  The biopsy results were consistent with adenocarcinoma of the esophagus.  He was discharged home on 04/08/2018. - Since discharge she has been doing well at his apartment.  He is able to drink Ensure, apple juice, cream of chicken, strained mushroom soup, chocolate milk, ice cream and milkshakes.  He has gained about 5 to 7 pounds.  He has mild fatigue which is stable.  He has nonspecific pains in his abdomen and back region.  He is taking Tylenol and aspirin for it.  Energy levels are  described as 25%.  Appetite is 50%.  He had a PET CT scan done yesterday.  He also had a brain MRI done recently.  MEDICAL HISTORY:  Past Medical History:  Diagnosis Date  . Esophageal mass   . GERD (gastroesophageal reflux disease)    denies chronic history  . HOH (hard of hearing)   . Hypertension     SURGICAL HISTORY: Past Surgical History:  Procedure Laterality Date  . ESOPHAGOGASTRODUODENOSCOPY N/A 04/06/2018   Procedure: ESOPHAGOGASTRODUODENOSCOPY (EGD);  Surgeon: Danie Binder, MD;  Location: [facility];  Service: Endoscopy;  Laterality: N/A;  possible dilation   . EYE SURGERY     as a child: artifical tear ducts    SOCIAL HISTORY: Social History   Socioeconomic History  . Marital status: Single    Spouse name: Not on file  . Number of children: Not on file  . Years of education: Not on file  . Highest education level: Not on file  Occupational History  . Not on file  Social Needs  . Financial resource strain: Not on file  . Food insecurity:    Worry: Not on file    Inability: Not on file  . Transportation needs:    Medical: Not on file    Non-medical: Not on file  Tobacco Use  . Smoking status: Former Smoker    Years: 45.00    Last attempt to quit: 09/04/2015    Years since quitting: 2.6  . Smokeless tobacco:  Never Used  . Tobacco comment: contiues to vape intermittently   Substance and Sexual Activity  . Alcohol use: Not Currently    Frequency: Never  . Drug use: Never  . Sexual activity: Not on file  Lifestyle  . Physical activity:    Days per week: Not on file    Minutes per session: Not on file  . Stress: Not on file  Relationships  . Social connections:    Talks on phone: Not on file    Gets together: Not on file    Attends religious service: Not on file    Active member of club or organization: Not on file    Attends meetings of clubs or organizations: Not on file    Relationship status: Not on file  . Intimate partner violence:     Fear of current or ex partner: Not on file    Emotionally abused: Not on file    Physically abused: Not on file    Forced sexual activity: Not on file  Other Topics Concern  . Not on file  Social History Narrative   One son, has not seen in some time. Has a girlfriend who lives in his apartment complex. Lives alone.     FAMILY HISTORY: Family History  Problem Relation Age of Onset  . Colon cancer Neg Hx   . Colon polyps Neg Hx     ALLERGIES:  is allergic to codeine.  MEDICATIONS:  Current Outpatient Medications  Medication Sig Dispense Refill  . amLODipine (NORVASC) 5 MG tablet Take 1 tablet (5 mg total) by mouth daily. 30 tablet 1  . Aspirin-Acetaminophen-Caffeine (EXCEDRIN PO) Take 1 tablet by mouth every 6 (six) hours.    . feeding supplement, ENSURE ENLIVE, (ENSURE ENLIVE) LIQD Take 237 mLs by mouth 4 (four) times daily. 28440 mL 0  . Amino Acids-Protein Hydrolys (FEEDING SUPPLEMENT, PRO-STAT SUGAR FREE 64,) LIQD Take 30 mLs by mouth 2 (two) times daily. (Patient not taking: Reported on 04/21/2018) 900 mL 0  . nicotine (NICODERM CQ - DOSED IN MG/24 HOURS) 14 mg/24hr patch Place 1 patch (14 mg total) onto the skin daily. (Patient not taking: Reported on 04/21/2018) 14 patch 0  . omeprazole (PRILOSEC) 20 MG capsule Take 1 capsule (20 mg total) by mouth 2 (two) times daily. (Patient not taking: Reported on 04/21/2018) 60 capsule 0   No current facility-administered medications for this visit.     REVIEW OF SYSTEMS:   Constitutional: Denies fevers, chills or abnormal night sweats.  Fatigue present. Eyes: Denies blurriness of vision, double vision or watery eyes Ears, nose, mouth, throat, and face: Denies mucositis or sore throat Respiratory: Denies cough, dyspnea or wheezes Cardiovascular: Denies palpitation, chest discomfort or lower extremity swelling Gastrointestinal: Trouble swallowing solid foods.  Constipation present.  Occasional nausea present. Skin: Denies abnormal skin  rashes Lymphatics: Denies new lymphadenopathy or easy bruising Neurological:Denies numbness, tingling or new weaknesses Behavioral/Psych: Mood is stable, no new changes  All other systems were reviewed with the patient and are negative.  PHYSICAL EXAMINATION: ECOG PERFORMANCE STATUS: 1 - Symptomatic but completely ambulatory  Vitals:   04/21/18 1319  BP: 129/90  Pulse: 68  Resp: 16  Temp: 97.6 F (36.4 C)  SpO2: 97%   Filed Weights   04/21/18 1319  Weight: 104 lb 3.2 oz (47.3 kg)    GENERAL:alert, no distress and comfortable SKIN: skin color, texture, turgor are normal, no rashes or significant lesions EYES: normal, conjunctiva are pink and non-injected, sclera clear  OROPHARYNX:no exudate, no erythema and lips, buccal mucosa, and tongue normal  NECK: supple, thyroid normal size, non-tender, without nodularity LYMPH:  no palpable lymphadenopathy in the cervical, axillary or inguinal LUNGS: clear to auscultation and percussion with normal breathing effort HEART: regular rate & rhythm and no murmurs and no lower extremity edema ABDOMEN:abdomen soft, non-tender and normal bowel sounds PSYCH: alert & oriented x 3 with fluent speech   LABORATORY DATA:  I have reviewed the data as listed Lab Results  Component Value Date   WBC 8.9 04/06/2018   HGB 14.0 04/06/2018   HCT 44.9 04/06/2018   MCV 99.8 04/06/2018   PLT 217 04/06/2018     Chemistry      Component Value Date/Time   NA 150 (H) 04/06/2018 0531   K 4.0 04/06/2018 0531   CL 113 (H) 04/06/2018 0531   CO2 27 04/06/2018 0531   BUN 30 (H) 04/06/2018 0531   CREATININE 0.86 04/06/2018 0531      Component Value Date/Time   CALCIUM 9.7 04/06/2018 0531   ALKPHOS 76 04/06/2018 0531   AST 32 04/06/2018 0531   ALT 33 04/06/2018 0531   BILITOT 1.1 04/06/2018 0531       RADIOGRAPHIC STUDIES: I have personally reviewed the radiological images as listed and agreed with the findings in the report. Dg Chest 2  View  Result Date: 04/04/2018 CLINICAL DATA:  Cough, weight loss. EXAM: CHEST - 2 VIEW COMPARISON:  None. FINDINGS: 6 cm mass within the RIGHT upper lobe. LEFT lung is clear. Prominence of the RIGHT hilum could be due to associated lymphadenopathy or ectasia of the ascending thoracic aorta. Heart size is normal. No acute or suspicious osseous finding. IMPRESSION: RIGHT upper lobe mass measuring approximately 6 cm greatest dimension. This is almost certainly a lung cancer. Recommend chest CT for further characterization and to exclude associated mediastinal and/or perihilar lymphadenopathy. Electronically Signed   By: Franki Cabot M.D.   On: 04/04/2018 13:25   Ct Chest W Contrast  Result Date: 04/04/2018 CLINICAL DATA:  Productive cough for the last several months. Worsening weakness. Weight loss. EXAM: CT CHEST, ABDOMEN, AND PELVIS WITH CONTRAST TECHNIQUE: Multidetector CT imaging of the chest, abdomen and pelvis was performed following the standard protocol during bolus administration of intravenous contrast. CONTRAST:  133mL ISOVUE-300 IOPAMIDOL (ISOVUE-300) INJECTION 61% COMPARISON:  Radiography same day. FINDINGS: CT CHEST FINDINGS Cardiovascular: Heart size is normal. No pericardial fluid. There is aortic atherosclerosis. There is aneurysmal dilatation of the ascending aorta with a maximal transverse diameter of 4.4 cm. There is extensive coronary artery calcification. Mediastinum/Nodes: There is right hilar lymphadenopathy measuring up to 3 cm. No mediastinal adenopathy. Esophagus is dilated and fluid-filled. There appears to be esophageal thickening distally. The possibility of esophageal carcinoma or stoppage I today is does exist. Lungs/Pleura: There is a partially necrotic peripheral right upper lobe mass measuring up to 5 cm in diameter consistent with primary lung cancer. There is benign appearing pleural and parenchymal scarring at both lung apices the tumor has a broad surface along the pleura. At  1 point, 1 could question if there is some nodular tumor extension into the region of the parietal pleura. Patient does not have any pleural fluid however. Musculoskeletal: Ordinary degenerative changes affect the thoracic spine. No sign of metastatic disease in the thorax. CT ABDOMEN PELVIS FINDINGS Hepatobiliary: Fatty change of the liver. No focal liver lesion is seen. No calcified gallstones. Pancreas: Normal Spleen: Normal Adrenals/Urinary Tract: No adrenal mass. Kidneys are  normal. Bladder is normal. Stomach/Bowel: Sigmoid diverticulosis without evidence of diverticulitis. No acute or significant bowel finding in the abdomen. Vascular/Lymphatic: Extensive atherosclerotic disease of the abdominal aorta and its branch vessels. Infrarenal abdominal aortic aneurysm with maximal transverse diameter of 2.8 cm. 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 Maximal diameter on the right is 1.7 cm. IVC is normal. No retroperitoneal adenopathy. Reproductive: Negative Other: No free fluid or air. Musculoskeletal: Lower lumbar degenerative changes. No sign of metastatic disease in the lumbosacral spine or the pelvis. IMPRESSION: 5 cm partially necrotic mass in the posterior right upper lobe with a broad surface along the pleura. Question nodular pleural invasion. No pleural fluid however. Metastatic adenopathy in the right hilum measuring up to 3 cm. Thickening of the distal esophagus with dilated fluid filled esophagus proximal to that. This raises the possibility of a distal esophageal carcinoma. Findings could be seen with esophagitis. Aortic atherosclerosis. 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 Coronary artery calcification. Abdominal aortic atherosclerosis. Infrarenal abdominal aortic aneurysm with maximal diameter of 2.8 cm. Electronically Signed   By: Nelson Chimes M.D.   On: 04/04/2018 14:47   Mr Jeri Cos GL Contrast  Result Date: 04/07/2018 CLINICAL DATA:   Initial evaluation for lung cancer staging. EXAM: MRI HEAD WITHOUT AND WITH CONTRAST TECHNIQUE: Multiplanar, multiecho pulse sequences of the brain and surrounding structures were obtained without and with intravenous contrast. CONTRAST:  61mL MULTIHANCE GADOBENATE DIMEGLUMINE 529 MG/ML IV SOLN COMPARISON:  Prior CT from 09/09/2007. FINDINGS: Brain: Generalized age-related cerebral atrophy. Mild T2/FLAIR hyperintensity within the periventricular and deep white matter both cerebral hemispheres, most consistent with chronic small vessel ischemic change, mild for age. No evidence for acute or subacute infarct. Gray-white matter differentiation maintained. Small remote right cerebellar infarct. No other areas of remote cortical infarction. No acute intracranial hemorrhage. Subcentimeter focus of susceptibility artifact within the central pons, consistent with a small chronic microhemorrhage, of doubtful significance in isolation. No mass lesion, midline shift, or mass effect. No hydrocephalus. No extra-axial fluid collection. No abnormal enhancement. No evidence for intracranial metastatic disease. Pituitary within normal limits. Vascular: Major intracranial vascular flow voids maintained left vertebral artery hypoplastic and likely terminates in PICA. Skull and upper cervical spine: Craniocervical junction within normal limits. Upper cervical spine normal. Single 5 mm T2 hyperintense enhancing lesion at the right frontal calvarium (series 12, image 62), indeterminate. No other discrete osseous lesion. Scalp soft tissues unremarkable. Sinuses/Orbits: Globes and orbital soft tissues within normal limits. Left maxillary sinus retention cyst. Paranasal sinuses are otherwise clear. Left mastoid effusion, likely sterile/benign. Inner ear structures normal. Other: None. IMPRESSION: 1. No evidence for intracranial metastatic disease. 2. Single 5 mm, indeterminate, but favored to reflect a benign etiology. Correlation with bone  scan suggested for further evaluation. At the right frontal calvarium 3. Age-related cerebral atrophy with mild chronic small vessel ischemic disease, with small remote right cerebellar infarct. Electronically Signed   By: Jeannine Boga M.D.   On: 04/07/2018 20:29   Ct Abdomen Pelvis W Contrast  Result Date: 04/04/2018 CLINICAL DATA:  Productive cough for the last several months. Worsening weakness. Weight loss. EXAM: CT CHEST, ABDOMEN, AND PELVIS WITH CONTRAST TECHNIQUE: Multidetector CT imaging of the chest, abdomen and pelvis was performed following the standard protocol during bolus administration of intravenous contrast. CONTRAST:  169mL ISOVUE-300 IOPAMIDOL (ISOVUE-300) INJECTION 61% COMPARISON:  Radiography same day. FINDINGS: CT CHEST FINDINGS Cardiovascular: Heart size is normal. No pericardial fluid. There is aortic  atherosclerosis. There is aneurysmal dilatation of the ascending aorta with a maximal transverse diameter of 4.4 cm. There is extensive coronary artery calcification. Mediastinum/Nodes: There is right hilar lymphadenopathy measuring up to 3 cm. No mediastinal adenopathy. Esophagus is dilated and fluid-filled. There appears to be esophageal thickening distally. The possibility of esophageal carcinoma or stoppage I today is does exist. Lungs/Pleura: There is a partially necrotic peripheral right upper lobe mass measuring up to 5 cm in diameter consistent with primary lung cancer. There is benign appearing pleural and parenchymal scarring at both lung apices the tumor has a broad surface along the pleura. At 1 point, 1 could question if there is some nodular tumor extension into the region of the parietal pleura. Patient does not have any pleural fluid however. Musculoskeletal: Ordinary degenerative changes affect the thoracic spine. No sign of metastatic disease in the thorax. CT ABDOMEN PELVIS FINDINGS Hepatobiliary: Fatty change of the liver. No focal liver lesion is seen. No  calcified gallstones. Pancreas: Normal Spleen: Normal Adrenals/Urinary Tract: No adrenal mass. Kidneys are normal. Bladder is normal. Stomach/Bowel: Sigmoid diverticulosis without evidence of diverticulitis. No acute or significant bowel finding in the abdomen. Vascular/Lymphatic: Extensive atherosclerotic disease of the abdominal aorta and its branch vessels. Infrarenal abdominal aortic aneurysm with maximal transverse diameter of 2.8 cm. 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 Maximal diameter on the right is 1.7 cm. IVC is normal. No retroperitoneal adenopathy. Reproductive: Negative Other: No free fluid or air. Musculoskeletal: Lower lumbar degenerative changes. No sign of metastatic disease in the lumbosacral spine or the pelvis. IMPRESSION: 5 cm partially necrotic mass in the posterior right upper lobe with a broad surface along the pleura. Question nodular pleural invasion. No pleural fluid however. Metastatic adenopathy in the right hilum measuring up to 3 cm. Thickening of the distal esophagus with dilated fluid filled esophagus proximal to that. This raises the possibility of a distal esophageal carcinoma. Findings could be seen with esophagitis. Aortic atherosclerosis. 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 Coronary artery calcification. Abdominal aortic atherosclerosis. Infrarenal abdominal aortic aneurysm with maximal diameter of 2.8 cm. Electronically Signed   By: Nelson Chimes M.D.   On: 04/04/2018 14:47   Nm Pet Image Initial (pi) Skull Base To Thigh  Result Date: 04/19/2018 CLINICAL DATA:  Initial treatment strategy for lung mass. EXAM: NUCLEAR MEDICINE PET SKULL BASE TO THIGH TECHNIQUE: 5.0 mCi F-18 FDG was injected intravenously. Full-ring PET imaging was performed from the skull base to thigh after the radiotracer. CT data was obtained and used for attenuation correction and anatomic localization. Fasting blood glucose: 94 mg/dl  COMPARISON:  Diagnostic chest CT 04/04/2018 FINDINGS: Mediastinal blood pool activity: SUV max 1.9 NECK: No hypermetabolic lymph nodes in the neck. Incidental CT findings: none CHEST: Posterior right upper lobe pulmonary mass is hypermetabolic with SUV max = 50.0. Central photopenia within the lesion is compatible with necrosis. 2.3 x 1.1 cm left lower lobe nodule (image 71/series 4) is hypermetabolic with SUV max = 2.7. Focus of ground-glass attenuation in the right lower lobe (image 85/series 4) shows low level FDG uptake with SUV max = 2.7. Another ill-defined nodular area in the medial right posterior costophrenic sulcus demonstrates FDG accumulation with SUV max = 4. Additional ill-defined pulmonary nodules in the right lung show low level FDG uptake. Right hilar lymphadenopathy is hypermetabolic with SUV max = 7.1. The marked circumferential wall thickening noted in the distal esophagus on the prior exam is markedly hypermetabolic  with SUV max = 10.2. Incidental CT findings: Ascending thoracic aorta measures 4.5 cm diameter. Coronary artery calcification is evident. Atherosclerotic calcification is noted in the wall of the thoracic aorta. Proximal and mid esophagus is distended and fluid-filled, suggesting that the distal esophageal lesion is obstructing. ABDOMEN/PELVIS: No abnormal hypermetabolic activity within the liver, pancreas, adrenal glands, or spleen. No hypermetabolic lymph nodes in the abdomen or pelvis. Incidental CT findings: Atherosclerotic calcification noted in the wall of the abdominal aorta measuring up to 2.6 cm diameter. Right common iliac artery measures 2.0 cm diameter. Prominent stool volume in the colon. SKELETON: No focal hypermetabolic activity to suggest skeletal metastasis. Incidental CT findings: none IMPRESSION: 1. Markedly hypermetabolic circumferential wall thickening in the distal esophagus, concerning for neoplasm. Esophagus prior to the lesion is distended and fluid-filled  suggesting that the distal esophageal lesion obstructs normal esophageal transit. 2. Dominant right upper lobe lung mass measured on the prior diagnostic CT at 5 cm is hypermetabolic consistent with malignancy. Lesion shows central necrosis. This is likely a primary lung cancer although metastatic disease is possible. 3. Numerous ill-defined bilateral pulmonary nodules, some of which approach ground-glass attenuation. The show low level FDG accumulation in given their small size, raise concern for metastatic disease. Hypermetabolic metastatic disease in the right hilum. 4.  Aortic Atherosclerois (ICD10-170.0) 5. Ascending thoracic aortic aneurysm. Recommend semi-annual imaging followup by CTA or MRA and referral to cardiothoracic surgery if not already obtained. This recommendation follows 2010 ACCF/AHA/AATS/ACR/ASA/SCA/SCAI/SIR/STS/SVM Guidelines for the Diagnosis and Management of Patients With Thoracic Aortic Disease. Circulation. 2010; 121: F573-U202. Electronically Signed   By: Misty Stanley M.D.   On: 04/19/2018 14:59    ASSESSMENT & PLAN:  Esophageal adenocarcinoma ([place]) 1.  Locoregional esophageal adenocarcinoma: - Presentation with unable to eat and 40 pound weight loss, CT scan CAP on 04/04/2018 showing right upper lobe mass with right hilar adenopathy, thickening of the distal esophagus. - EGD on 04/06/2018 showing malignant appearing stenosis of the distal esophagus extending from 40 cm to 45 cm, stenosis measuring 9 mm, biopsy consistent with adenocarcinoma.  There is also stenosis of the pylorus. - I discussed the results of the PET CT scan dated 04/20/2018 which shows right upper lobe lung mass, right hilar adenopathy, multiple groundglass opacities in both lungs with increased FDG uptake, hypermetabolic GE junction cancer.  MRI of the brain was negative for parenchymal metastatic disease. - Patient is reluctant to consider biopsy of the right lung mass.  I have that it is very important for [place] to  have this diagnosis to plan future treatment of both of his cancers.  He finally agreed for the biopsy.  We will set it up by interventional radiology. 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  He has an appointment to see Dr. Arnoldo Morale tomorrow.  However he wants to delay it by couple of weeks as he is getting new hearing aids tomorrow. -I will see him back in 1 week after the biopsy to discuss results and treatment plan.  Orders Placed This Encounter  Procedures  . CT BIOPSY    Standing Status:   Future    Standing Expiration Date:   07/23/2019    Order Specific Question:   Lab orders requested (DO NOT place separate lab orders, these will be automatically ordered during procedure specimen collection):    Answer:   Surgical Pathology    Order Specific Question:   Reason  for Exam (SYMPTOM  OR DIAGNOSIS REQUIRED)    Answer:   right lung mass    Order Specific Question:   Preferred imaging location?    Answer:   [facility]    Order Specific Question:   Radiology Contrast Protocol - do NOT remove file path    Answer:   [email or web address]  . Ambulatory referral to Social Work    Referral Priority:   Routine    Referral Type:   Consultation    Referral Reason:   Specialty Services Required    Number of Visits Requested:   1    All questions were answered. The patient knows to call the clinic with any problems, questions or concerns. Total time spent is 45 minutes with more than 50% of the time spent face-to-face discussing his diagnosis, further plan, treatment options, and coordination of care.     [name], MD 04/21/2018 6:00 PM

## 2018-04-22 ENCOUNTER — Ambulatory Visit: Payer: Medicare Other | Admitting: General Surgery

## 2018-04-22 ENCOUNTER — Encounter (HOSPITAL_COMMUNITY): Payer: Self-pay | Admitting: General Practice

## 2018-04-22 NOTE — Progress Notes (Signed)
[facility] Psychosocial Distress Screening Clinical Social Work  Clinical Social Work was referred by distress screening protocol.  The patient scored a 5 on the Psychosocial Distress Thermometer which indicates moderate distress. Clinical Social Worker contacted patient by phone to assess for distress and other psychosocial needs. Unable to reach patient, left VM and requested call back.  Also provided information on support services available through [facility].  ONCBCN DISTRESS SCREENING 04/21/2018  Screening Type Initial Screening  Distress experienced in past week (1-10) 5  Practical problem type Insurance  Emotional problem type Adjusting to illness  Physical Problem type Nausea/vomiting;Getting around;Loss of appetitie;Skin dry/itchy  Physician notified of physical symptoms Yes    Clinical Social Worker follow up needed: Yes.    If yes, follow up plan:  Await return call and/or recontact  Beverely Pace, [place], [place] Worker Phone:  [phone]

## 2018-04-29 ENCOUNTER — Telehealth: Payer: Self-pay | Admitting: General Practice

## 2018-04-29 NOTE — Telephone Encounter (Signed)
[facility] CSW Progress Note  Second attempt to complete Distress Screen protocol; no answer, left VM requesting call back at his convenience if desired.  Edwyna Shell, LCSW Clinical Social Worker Phone:  [phone]

## 2018-05-04 ENCOUNTER — Ambulatory Visit: Payer: Medicare Other | Admitting: General Surgery

## 2018-05-10 ENCOUNTER — Ambulatory Visit (HOSPITAL_COMMUNITY): Payer: Medicare Other | Admitting: Hematology

## 2018-05-25 ENCOUNTER — Encounter (HOSPITAL_COMMUNITY): Payer: Self-pay | Admitting: *Deleted

## 2018-05-25 NOTE — Progress Notes (Signed)
Francene Finders, NP asked that I call patient and get him scheduled for his procedures so we can proceed with treatment if he so desires.    I called patient to touch base and find out if he is willing to have j-tube placed and biopsy of lung performed.  Patient states that the reason he didn't show for any of the other appointments is " I want a different doctor".  I explained to him that we can get him in with a doctor in [place] and he is agreeable.  He verbalizes intent of going to these appointments.  We will get him an appointment one week after biopsy in our office to follow up, this is per Dr. Tomie China last office note.  Patient states that he is having trouble swallowing foods and getting them to go down.  He is also c/o clear sputum constantly coming up.    I spoke with Armen, triage nurse, at [facility].  I faxed over our recent office note and she is going to present it to her physicians and see which one can see him the soonest.  She will call me back either today or tomorrow with an appointment for the patient.

## 2018-05-26 ENCOUNTER — Other Ambulatory Visit: Payer: Self-pay

## 2018-05-26 ENCOUNTER — Other Ambulatory Visit (HOSPITAL_COMMUNITY): Payer: Self-pay | Admitting: Nurse Practitioner

## 2018-05-26 ENCOUNTER — Encounter (HOSPITAL_COMMUNITY): Payer: Self-pay | Admitting: *Deleted

## 2018-05-26 ENCOUNTER — Encounter (HOSPITAL_COMMUNITY): Payer: Self-pay | Admitting: Emergency Medicine

## 2018-05-26 ENCOUNTER — Inpatient Hospital Stay (HOSPITAL_COMMUNITY)
Admission: EM | Admit: 2018-05-26 | Discharge: 2018-05-29 | DRG: 640 | Disposition: A | Discharge status: Hospice | Payer: Medicare Other | Attending: Internal Medicine | Admitting: Internal Medicine

## 2018-05-26 ENCOUNTER — Emergency Department (HOSPITAL_COMMUNITY): Payer: Medicare Other

## 2018-05-26 DIAGNOSIS — R1310 Dysphagia, unspecified: Secondary | ICD-10-CM | POA: Diagnosis not present

## 2018-05-26 DIAGNOSIS — H9190 Unspecified hearing loss, unspecified ear: Secondary | ICD-10-CM | POA: Diagnosis present

## 2018-05-26 DIAGNOSIS — F17290 Nicotine dependence, other tobacco product, uncomplicated: Secondary | ICD-10-CM | POA: Diagnosis present

## 2018-05-26 DIAGNOSIS — Z885 Allergy status to narcotic agent status: Secondary | ICD-10-CM

## 2018-05-26 DIAGNOSIS — K222 Esophageal obstruction: Secondary | ICD-10-CM | POA: Diagnosis not present

## 2018-05-26 DIAGNOSIS — R531 Weakness: Secondary | ICD-10-CM

## 2018-05-26 DIAGNOSIS — Z79899 Other long term (current) drug therapy: Secondary | ICD-10-CM

## 2018-05-26 DIAGNOSIS — C159 Malignant neoplasm of esophagus, unspecified: Secondary | ICD-10-CM | POA: Diagnosis not present

## 2018-05-26 DIAGNOSIS — R64 Cachexia: Secondary | ICD-10-CM | POA: Diagnosis present

## 2018-05-26 DIAGNOSIS — R112 Nausea with vomiting, unspecified: Secondary | ICD-10-CM | POA: Diagnosis present

## 2018-05-26 DIAGNOSIS — I10 Essential (primary) hypertension: Secondary | ICD-10-CM | POA: Diagnosis present

## 2018-05-26 DIAGNOSIS — R627 Adult failure to thrive: Secondary | ICD-10-CM | POA: Diagnosis not present

## 2018-05-26 DIAGNOSIS — R634 Abnormal weight loss: Secondary | ICD-10-CM | POA: Diagnosis present

## 2018-05-26 DIAGNOSIS — Z888 Allergy status to other drugs, medicaments and biological substances status: Secondary | ICD-10-CM

## 2018-05-26 DIAGNOSIS — Z681 Body mass index (BMI) 19 or less, adult: Secondary | ICD-10-CM

## 2018-05-26 DIAGNOSIS — Z720 Tobacco use: Secondary | ICD-10-CM | POA: Diagnosis present

## 2018-05-26 DIAGNOSIS — I712 Thoracic aortic aneurysm, without rupture: Secondary | ICD-10-CM | POA: Diagnosis present

## 2018-05-26 DIAGNOSIS — R590 Localized enlarged lymph nodes: Secondary | ICD-10-CM | POA: Diagnosis present

## 2018-05-26 DIAGNOSIS — E43 Unspecified severe protein-calorie malnutrition: Secondary | ICD-10-CM | POA: Diagnosis present

## 2018-05-26 DIAGNOSIS — Z515 Encounter for palliative care: Secondary | ICD-10-CM

## 2018-05-26 DIAGNOSIS — R918 Other nonspecific abnormal finding of lung field: Secondary | ICD-10-CM | POA: Diagnosis present

## 2018-05-26 DIAGNOSIS — K219 Gastro-esophageal reflux disease without esophagitis: Secondary | ICD-10-CM | POA: Diagnosis present

## 2018-05-26 DIAGNOSIS — Z66 Do not resuscitate: Secondary | ICD-10-CM | POA: Diagnosis present

## 2018-05-26 DIAGNOSIS — Z7189 Other specified counseling: Secondary | ICD-10-CM

## 2018-05-26 LAB — URINALYSIS, ROUTINE W REFLEX MICROSCOPIC
BILIRUBIN URINE: NEGATIVE
Glucose, UA: NEGATIVE mg/dL
HGB URINE DIPSTICK: NEGATIVE
Ketones, ur: 20 mg/dL — AB
Leukocytes, UA: NEGATIVE
NITRITE: NEGATIVE
PH: 5 (ref 5.0–8.0)
Protein, ur: NEGATIVE mg/dL
SPECIFIC GRAVITY, URINE: 1.02 (ref 1.005–1.030)

## 2018-05-26 LAB — COMPREHENSIVE METABOLIC PANEL
ALBUMIN: 2.8 g/dL — AB (ref 3.5–5.0)
ALK PHOS: 200 U/L — AB (ref 38–126)
ALT: 40 U/L (ref 0–44)
AST: 28 U/L (ref 15–41)
Anion gap: 8 (ref 5–15)
BUN: 26 mg/dL — AB (ref 8–23)
CALCIUM: 9.2 mg/dL (ref 8.9–10.3)
CHLORIDE: 105 mmol/L (ref 98–111)
CO2: 28 mmol/L (ref 22–32)
CREATININE: 0.81 mg/dL (ref 0.61–1.24)
GFR calc non Af Amer: >60 mL/min (ref >60)
GLUCOSE: 98 mg/dL (ref 70–99)
Potassium: 4.1 mmol/L (ref 3.5–5.1)
SODIUM: 141 mmol/L (ref 135–145)
Total Bilirubin: 0.8 mg/dL (ref 0.3–1.2)
Total Protein: 6.2 g/dL — ABNORMAL LOW (ref 6.5–8.1)

## 2018-05-26 LAB — CBC WITH DIFFERENTIAL/PLATELET
BASOS PCT: 0 %
Basophils Absolute: 0 10*3/uL (ref 0.0–0.1)
EOS ABS: 0.2 10*3/uL (ref 0.0–0.7)
EOS PCT: 2 %
HCT: 38.2 % — ABNORMAL LOW (ref 39.0–52.0)
HEMOGLOBIN: 12.4 g/dL — AB (ref 13.0–17.0)
LYMPHS ABS: 0.7 10*3/uL (ref 0.7–4.0)
Lymphocytes Relative: 6 %
MCH: 31.7 pg (ref 26.0–34.0)
MCHC: 32.5 g/dL (ref 30.0–36.0)
MCV: 97.7 fL (ref 78.0–100.0)
MONO ABS: 0.9 10*3/uL (ref 0.1–1.0)
MONOS PCT: 8 %
Neutro Abs: 9.5 10*3/uL — ABNORMAL HIGH (ref 1.7–7.7)
Neutrophils Relative %: 84 %
Platelets: 235 10*3/uL (ref 150–400)
RBC: 3.91 MIL/uL — ABNORMAL LOW (ref 4.22–5.81)
RDW: 13.5 % (ref 11.5–15.5)
WBC: 11.4 10*3/uL — ABNORMAL HIGH (ref 4.0–10.5)

## 2018-05-26 LAB — PHOSPHORUS: PHOSPHORUS: 3.6 mg/dL (ref 2.5–4.6)

## 2018-05-26 LAB — MAGNESIUM: MAGNESIUM: 2 mg/dL (ref 1.7–2.4)

## 2018-05-26 MED ORDER — ONDANSETRON HCL 4 MG/2ML IJ SOLN
4.0000 mg | Freq: Four times a day (QID) | INTRAMUSCULAR | Status: DC | PRN
  Administered 2018-05-26 – 2018-05-28 (×2): 4 mg via INTRAVENOUS
  Filled 2018-05-26 (×3): qty 2

## 2018-05-26 MED ORDER — ENOXAPARIN SODIUM 30 MG/0.3ML ~~LOC~~ SOLN
30.0000 mg | SUBCUTANEOUS | Status: DC
  Administered 2018-05-26: 30 mg via SUBCUTANEOUS
  Filled 2018-05-26 (×2): qty 0.3

## 2018-05-26 MED ORDER — BOOST / RESOURCE BREEZE PO LIQD CUSTOM
1.0000 | Freq: Three times a day (TID) | ORAL | Status: DC
  Administered 2018-05-27 – 2018-05-28 (×4): 1 via ORAL

## 2018-05-26 MED ORDER — SODIUM CHLORIDE 0.9 % IV SOLN
INTRAVENOUS | Status: DC
  Administered 2018-05-26 – 2018-05-28 (×4): via INTRAVENOUS

## 2018-05-26 MED ORDER — SODIUM CHLORIDE 0.9 % IV BOLUS
1000.0000 mL | Freq: Once | INTRAVENOUS | Status: AC
  Administered 2018-05-26: 1000 mL via INTRAVENOUS

## 2018-05-26 NOTE — ED Notes (Signed)
Pt going to xray  

## 2018-05-26 NOTE — H&P (Signed)
History and Physical    Joshua Schwartz TFT:[id number] DOB: 02/21/1951 DOA: 05/26/2018  PCP: Patient, No Pcp Per  Patient coming from: Home.   I have personally briefly reviewed patient's old medical records in [place]  Chief Complaint: unable to take anything by mouth.   HPI: Joshua Schwartz is a 67 y.o. male with medical history significant of esophageal adenocarcinoma recently diagnosed, GERD, tobacco abuse, comes in for poor po intake, unable to take food orally, worsening weight loss for over an year,. He also reports nausea, constant spitting of food and saliva and occasional vomiting. He denies fever or chills, reports some chest pressure on deep breathing and sob.He saw oncology Dr Delton Coombes on 6/19 and was scheduled to have a lung biopsy for the lung mass by IR and to see Dr Arnoldo Morale for a J TUBE placement, and he couldn't keep the appointment.   ED Course: on arrival to ED, he was afebrile, hypertensive, labs reveal mild leukocytosis of 11.400, hemoglobin of 12,400. Elevated alk phos and low albumin of 2.8. Normal liver function panel. He was referred to medical service for admission for evaluation of failure to thrive. Dr Constance Haw from surgery consulted for evaluation of J tube placement for tomorrow.   Review of Systems: As per HPI otherwise 10 point review of systems negative.    Past Medical History:  Diagnosis Date  . Esophageal mass   . GERD (gastroesophageal reflux disease)    denies chronic history  . HOH (hard of hearing)   . Hypertension     Past Surgical History:  Procedure Laterality Date  . ESOPHAGOGASTRODUODENOSCOPY N/A 04/06/2018   Procedure: ESOPHAGOGASTRODUODENOSCOPY (EGD);  Surgeon: Danie Binder, MD;  [facility];  Service: Endoscopy;  Laterality: N/A;  possible dilation   . EYE SURGERY     as a child: artifical tear ducts     reports that he quit smoking about 2 years ago. He quit after 45.00 years of use. He has never used smokeless  tobacco. He reports that he drank alcohol. He reports that he does not use drugs.  Allergies  Allergen Reactions  . Codeine     Altered mental status  . Omeprazole Nausea And Vomiting    Family History  Problem Relation Age of Onset  . Colon cancer Neg Hx   . Colon polyps Neg Hx    Family history reviewed and pertinent.   Prior to Admission medications   Medication Sig Start Date End Date Taking? Authorizing Provider  acetaminophen (TYLENOL) 500 MG tablet Take 500 mg by mouth every 6 (six) hours as needed for mild pain or moderate pain.   Yes [provider]  amLODipine (NORVASC) 5 MG tablet Take 1 tablet (5 mg total) by mouth daily. 04/08/18 05/26/18 Yes Johnson, Clanford L, MD  Amino Acids-Protein Hydrolys (FEEDING SUPPLEMENT, PRO-STAT SUGAR FREE 64,) LIQD Take 30 mLs by mouth 2 (two) times daily. Patient not taking: Reported on 04/21/2018 04/08/18   Irwin Brakeman L, MD  nicotine (NICODERM CQ - DOSED IN MG/24 HOURS) 14 mg/24hr patch Place 1 patch (14 mg total) onto the skin daily. Patient not taking: Reported on 04/21/2018 04/09/18   Murlean Iba, MD  omeprazole (PRILOSEC) 20 MG capsule Take 1 capsule (20 mg total) by mouth 2 (two) times daily. Patient not taking: Reported on 04/21/2018 04/04/18   Tanna Furry, MD    Physical Exam: Vitals:   05/26/18 1602 05/26/18 1608 05/26/18 1800 05/26/18 1929  BP: (!) 151/94  Marland Kitchen)  178/95 (!) 180/99  Pulse:    86  Resp: (!) 22  (!) 23 (!) 21  Temp: 98 F (36.7 C)     TempSrc: Oral     SpO2: 98%   100%  Weight:  41.1 kg (90 lb 9.6 oz)    Height:  5' 8"  (1.727 m)      Constitutional: cachetic looking gentleman, not in distress.  Vitals:   05/26/18 1602 05/26/18 1608 05/26/18 1800 05/26/18 1929  BP: (!) 151/94  (!) 178/95 (!) 180/99  Pulse:    86  Resp: (!) 22  (!) 23 (!) 21  Temp: 98 F (36.7 C)     TempSrc: Oral     SpO2: 98%   100%  Weight:  41.1 kg (90 lb 9.6 oz)    Height:  5' 8"  (1.727 m)     Eyes: PERRL, lids and  conjunctivae normal ENMT: Mucous membranes are dry. Neck: normal, supple, no masses, no thyromegaly Respiratory:diminished air entry bilaterally. No wheezing heard.  Cardiovascular: Regular rate and rhythm, murmer present.  Abdomen: scaphoid , mild gen tenderness. Bowel sounds diminished.  Musculoskeletal: poor muscle tone, wasting of the muscles, no pedal or leg edema.  Skin: no rashes, lesions, ulcers. No induration Neurologic: CN 2-12 grossly intact. Sensation intact,  Psychiatric:  Alert and oriented x 3. Normal mood.    Labs on Admission: I have personally reviewed following labs and imaging studies  CBC: Recent Labs  Lab 05/26/18 1649  WBC 11.4*  NEUTROABS 9.5*  HGB 12.4*  HCT 38.2*  MCV 97.7  PLT 465   Basic Metabolic Panel: Recent Labs  Lab 05/26/18 1649  NA 141  K 4.1  CL 105  CO2 28  GLUCOSE 98  BUN 26*  CREATININE 0.81  CALCIUM 9.2   GFR: Estimated Creatinine Clearance: 52.9 mL/min (by C-G formula based on SCr of 0.81 mg/dL). Liver Function Tests: Recent Labs  Lab 05/26/18 1649  AST 28  ALT 40  ALKPHOS 200*  BILITOT 0.8  PROT 6.2*  ALBUMIN 2.8*   No results for input(s): LIPASE, AMYLASE in the last 168 hours. No results for input(s): AMMONIA in the last 168 hours. Coagulation Profile: No results for input(s): INR, PROTIME in the last 168 hours. Cardiac Enzymes: No results for input(s): CKTOTAL, CKMB, CKMBINDEX, TROPONINI in the last 168 hours. BNP (last 3 results) No results for input(s): PROBNP in the last 8760 hours. HbA1C: No results for input(s): HGBA1C in the last 72 hours. CBG: No results for input(s): GLUCAP in the last 168 hours. Lipid Profile: No results for input(s): CHOL, HDL, LDLCALC, TRIG, CHOLHDL, LDLDIRECT in the last 72 hours. Thyroid Function Tests: No results for input(s): TSH, T4TOTAL, FREET4, T3FREE, THYROIDAB in the last 72 hours. Anemia Panel: No results for input(s): VITAMINB12, FOLATE, FERRITIN, TIBC, IRON,  RETICCTPCT in the last 72 hours. Urine analysis:    Component Value Date/Time   COLORURINE YELLOW 05/26/2018 1628   APPEARANCEUR CLEAR 05/26/2018 1628   LABSPEC 1.020 05/26/2018 1628   PHURINE 5.0 05/26/2018 1628   GLUCOSEU NEGATIVE 05/26/2018 1628   HGBUR NEGATIVE 05/26/2018 1628   BILIRUBINUR NEGATIVE 05/26/2018 1628   KETONESUR 20 (A) 05/26/2018 1628   PROTEINUR NEGATIVE 05/26/2018 1628   NITRITE NEGATIVE 05/26/2018 1628   LEUKOCYTESUR NEGATIVE 05/26/2018 1628    Radiological Exams on Admission: Dg Abd Acute W/chest  Result Date: 05/26/2018 CLINICAL DATA:  Vomiting. EXAM: DG ABDOMEN ACUTE W/ 1V CHEST COMPARISON:  PET-CT dated April 19, 2018. FINDINGS: The  heart size and mediastinal contours are within normal limits. Normal pulmonary vascularity. Grossly unchanged right upper lobe mass and right hilar lymphadenopathy. No focal consolidation, pleural effusion, or pneumothorax. There is no evidence of dilated bowel loops or free intraperitoneal air. No radiopaque calculi or other significant radiographic abnormality is seen. No acute osseous abnormality. IMPRESSION: 1. Negative abdominal radiographs. 2. Grossly unchanged right upper lobe lung mass and right hilar lymphadenopathy. 3.  No active cardiopulmonary disease. Electronically Signed   By: Titus Dubin M.D.   On: 05/26/2018 17:20    EKG: Independently reviewed. Sinus with left atrial enlargement.   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  He was  scheduled to have a biopsy of the lung mass by IR.  He will need surgery evaluation for J TUBE placement tomorrow by Dr Constance Haw.  Nutrition consulted and check for magnesium and phosphorus levels.  Watch for refeeding syndrome.  He will probably need dilatation of the stenosis from GE junction malignancy. IV hydration , IV anti emetics.    Severe Protein calorie malnutrition: Nutrition consulted.    GERD:  Start the patient on PPI    DVT prophylaxis: lovenox.  Code Status: full code.  Family Communication: family member at bedside.  Disposition Plan: pending clinical improvement and surgery evaluation.  Consults called: Surgery Dr Constance Haw  Admission status:    Hosie Poisson MD [facility] Pager [phone]  If 7PM-7AM, please contact night-coverage [email or web address] Password [facility]  05/26/2018, 8:08 PM

## 2018-05-26 NOTE — ED Notes (Signed)
Pt was informed that we need a urine sample. Pt states that he can try. Pt was given a urinal.

## 2018-05-26 NOTE — ED Provider Notes (Addendum)
[facility] EMERGENCY DEPARTMENT Provider Note   CSN: [id number] Arrival date & time: 05/26/18  1556     History   Chief Complaint Chief Complaint  Patient presents with  . Failure To Thrive    HPI Joshua Schwartz is a 67 y.o. male.  HPI  67 year old male with a recent diagnosis of an esophageal mass, pyloric stenosis, right lung mass presents with weakness and inability to tolerate oral fluids and food.  The patient has lost about 15 pounds recently is used to be 104 pounds and now is 90 on arrival here.  The patient states he is frequently spitting up clear fluid.  He states is more like vomiting and is not really a cough.  He has pain all over but no new pain.  There is no headache, chest pain, shortness of breath or abdominal pain.  He feels generally weak and is only able to ambulate 10 or 20 feet before he is too tired and has to sit down.  Past Medical History:  Diagnosis Date  . Esophageal mass   . GERD (gastroesophageal reflux disease)    denies chronic history  . HOH (hard of hearing)   . Hypertension     Patient Active Problem List   Diagnosis Date Noted  . Esophageal adenocarcinoma ([place]) 04/21/2018  . Protein-calorie malnutrition, severe 04/07/2018  . Esophageal stricture   . Lung mass   . Food impaction of esophagus   . Tobacco abuse 04/05/2018  . GERD (gastroesophageal reflux disease) 04/05/2018  . Weight loss, abnormal 04/05/2018  . Intractable nausea and vomiting 04/05/2018  . Aortic aneurysm ([place]) 04/05/2018  . Dysphagia 04/05/2018    Past Surgical History:  Procedure Laterality Date  . ESOPHAGOGASTRODUODENOSCOPY N/A 04/06/2018   Procedure: ESOPHAGOGASTRODUODENOSCOPY (EGD);  Surgeon: Danie Binder, MD;  Location: [facility];  Service: Endoscopy;  Laterality: N/A;  possible dilation   . EYE SURGERY     as a child: artifical tear ducts        Home Medications    Prior to Admission medications   Medication Sig Start Date End Date Taking?  Authorizing Provider  acetaminophen (TYLENOL) 500 MG tablet Take 500 mg by mouth every 6 (six) hours as needed for mild pain or moderate pain.   Yes [provider]  amLODipine (NORVASC) 5 MG tablet Take 1 tablet (5 mg total) by mouth daily. 04/08/18 05/26/18 Yes Johnson, Clanford L, MD  Amino Acids-Protein Hydrolys (FEEDING SUPPLEMENT, PRO-STAT SUGAR FREE 64,) LIQD Take 30 mLs by mouth 2 (two) times daily. Patient not taking: Reported on 04/21/2018 04/08/18   Irwin Brakeman L, MD  nicotine (NICODERM CQ - DOSED IN MG/24 HOURS) 14 mg/24hr patch Place 1 patch (14 mg total) onto the skin daily. Patient not taking: Reported on 04/21/2018 04/09/18   Murlean Iba, MD  omeprazole (PRILOSEC) 20 MG capsule Take 1 capsule (20 mg total) by mouth 2 (two) times daily. Patient not taking: Reported on 04/21/2018 04/04/18   Tanna Furry, MD    Family History Family History  Problem Relation Age of Onset  . Colon cancer Neg Hx   . Colon polyps Neg Hx     Social History Social History   Tobacco Use  . Smoking status: Former Smoker    Years: 45.00    Last attempt to quit: 09/04/2015    Years since quitting: 2.7  . Smokeless tobacco: Never Used  . Tobacco comment: contiues to vape intermittently   Substance Use Topics  .  Alcohol use: Not Currently    Frequency: Never  . Drug use: Never     Allergies   Codeine and Omeprazole   Review of Systems Review of Systems  Constitutional: Positive for fatigue and unexpected weight change. Negative for fever.  Respiratory: Negative for shortness of breath.   Cardiovascular: Negative for chest pain.  Gastrointestinal: Positive for constipation (last bm 5-6 days ago) and vomiting. Negative for abdominal pain and diarrhea.  Neurological: Positive for weakness. Negative for dizziness and headaches.  All other systems reviewed and are negative.    Physical Exam Updated Vital Signs BP (!) 151/94 (BP Location: Left Arm)   Temp 98 F (36.7 C)  (Oral)   Resp (!) 22   Ht 5\' 8"  (1.727 m)   Wt 41.1 kg (90 lb 9.6 oz)   SpO2 98%   BMI 13.78 kg/m   Physical Exam  Constitutional: He is oriented to person, place, and time. He appears well-developed. He appears cachectic. No distress.  HENT:  Head: Normocephalic and atraumatic.  Right Ear: External ear normal.  Left Ear: External ear normal.  Nose: Nose normal.  Eyes: Right eye exhibits no discharge. Left eye exhibits no discharge.  Neck: Neck supple.  Cardiovascular: Normal rate and regular rhythm.  Murmur heard. Pulmonary/Chest: Effort normal and breath sounds normal.  Abdominal: Soft. He exhibits no distension. There is no tenderness.  Musculoskeletal: He exhibits no edema.  Neurological: He is alert and oriented to person, place, and time.  Skin: Skin is warm and dry. He is not diaphoretic.  Nursing note and vitals reviewed.    ED Treatments / Results  Labs (all labs ordered are listed, but only abnormal results are displayed) Labs Reviewed  COMPREHENSIVE METABOLIC PANEL - Abnormal; Notable for the following components:      Result Value   BUN 26 (*)    Total Protein 6.2 (*)    Albumin 2.8 (*)    Alkaline Phosphatase 200 (*)    All other components within normal limits  CBC WITH DIFFERENTIAL/PLATELET - Abnormal; Notable for the following components:   WBC 11.4 (*)    RBC 3.91 (*)    Hemoglobin 12.4 (*)    HCT 38.2 (*)    Neutro Abs 9.5 (*)    All other components within normal limits  URINALYSIS, ROUTINE W REFLEX MICROSCOPIC    EKG EKG Interpretation  Date/Time:  Wednesday May 26 2018 16:02:28 EDT Ventricular Rate:  68 PR Interval:    QRS Duration: 100 QT Interval:  438 QTC Calculation: 466 R Axis:   -62 Text Interpretation:  Sinus rhythm Probable left atrial enlargement Left anterior fascicular block Abnormal R-wave progression, early transition LVH with secondary repolarization abnormality Anterior Q waves, possibly due to LVH poor baseline precludes  interpretation No old tracing to compare Confirmed by Sherwood Gambler [phone]) on 05/26/2018 4:14:31 PM   Radiology Dg Abd Acute W/chest  Result Date: 05/26/2018 CLINICAL DATA:  Vomiting. EXAM: DG ABDOMEN ACUTE W/ 1V CHEST COMPARISON:  PET-CT dated April 19, 2018. FINDINGS: The heart size and mediastinal contours are within normal limits. Normal pulmonary vascularity. Grossly unchanged right upper lobe mass and right hilar lymphadenopathy. No focal consolidation, pleural effusion, or pneumothorax. There is no evidence of dilated bowel loops or free intraperitoneal air. No radiopaque calculi or other significant radiographic abnormality is seen. No acute osseous abnormality. IMPRESSION: 1. Negative abdominal radiographs. 2. Grossly unchanged right upper lobe lung mass and right hilar lymphadenopathy. 3.  No active cardiopulmonary disease. Electronically  Signed   By: Titus Dubin M.D.   On: 05/26/2018 17:20    Procedures Procedures (including critical care time)  Medications Ordered in ED Medications  sodium chloride 0.9 % bolus 1,000 mL (1,000 mLs Intravenous New Bag/Given 05/26/18 1635)     Initial Impression / Assessment and Plan / ED Course  I have reviewed the triage vital signs and the nursing notes.  Pertinent labs & imaging results that were available during my care of the patient were reviewed by me and considered in my medical decision making (see chart for details).     Patient's labs look okay except for decreased albumin and protein which goes along with his poor nutrition.  However he does not have any significant liver or renal failure.  However he is unable to tolerate p.o. and with his esophageal cancer this is likely to not improved significantly without other treatments.  I discussed with Dr. Constance Haw who will see him in consultation tomorrow.  Discussed with the hospitalist to admit for observation and further fluids and hydration. This does not appear infectious.  Final  Clinical Impressions(s) / ED Diagnoses   Final diagnoses:  Generalized weakness    ED Discharge Orders    None       Sherwood Gambler, MD 05/26/18 6147    Sherwood Gambler, MD 05/26/18 1843

## 2018-05-26 NOTE — Progress Notes (Signed)
I spoke with Abigail Butts, triage nurse at [facility] today and she states that Dr. Windle Guard reviewed patient's chart and will not see him due to patient's noncompliance.   I spoke with Francene Finders, NP and she advised that patient could come back to our office to be seen sooner than his scheduled appointment.    I attempted to call patient and reached his significant other, [name]. She states that patient was so sick today she had him taken via EMS to the emergency room, where he is currently being treated.

## 2018-05-26 NOTE — ED Triage Notes (Signed)
Pt comes states he can not eat or drink x 1 week. " I am always spitting up clear stringy sputum. Cancer center called pt today, has been working with him to have feeding tube placed. Pt states he rather have a different procedure, but I guess there is not else to do but have the surgery.

## 2018-05-27 ENCOUNTER — Other Ambulatory Visit: Payer: Self-pay | Admitting: Physician Assistant

## 2018-05-27 ENCOUNTER — Other Ambulatory Visit: Payer: Self-pay

## 2018-05-27 DIAGNOSIS — C159 Malignant neoplasm of esophagus, unspecified: Secondary | ICD-10-CM

## 2018-05-27 DIAGNOSIS — R634 Abnormal weight loss: Secondary | ICD-10-CM

## 2018-05-27 DIAGNOSIS — R918 Other nonspecific abnormal finding of lung field: Secondary | ICD-10-CM | POA: Diagnosis not present

## 2018-05-27 DIAGNOSIS — Z7189 Other specified counseling: Secondary | ICD-10-CM

## 2018-05-27 DIAGNOSIS — R112 Nausea with vomiting, unspecified: Secondary | ICD-10-CM

## 2018-05-27 DIAGNOSIS — K219 Gastro-esophageal reflux disease without esophagitis: Secondary | ICD-10-CM | POA: Diagnosis not present

## 2018-05-27 DIAGNOSIS — R1310 Dysphagia, unspecified: Secondary | ICD-10-CM | POA: Diagnosis not present

## 2018-05-27 DIAGNOSIS — R627 Adult failure to thrive: Principal | ICD-10-CM

## 2018-05-27 MED ORDER — ADULT MULTIVITAMIN LIQUID CH
15.0000 mL | Freq: Every day | ORAL | Status: DC
  Administered 2018-05-27 – 2018-05-29 (×3): 15 mL via ORAL
  Filled 2018-05-27 (×5): qty 15

## 2018-05-27 MED ORDER — PRO-STAT SUGAR FREE PO LIQD
60.0000 mL | Freq: Two times a day (BID) | ORAL | Status: DC
  Administered 2018-05-27 – 2018-05-28 (×2): 60 mL via ORAL
  Administered 2018-05-29: 30 mL via ORAL
  Filled 2018-05-27 (×5): qty 60

## 2018-05-27 MED ORDER — ACETAMINOPHEN 325 MG PO TABS
650.0000 mg | ORAL_TABLET | Freq: Four times a day (QID) | ORAL | Status: DC | PRN
  Administered 2018-05-27 – 2018-05-28 (×3): 650 mg via ORAL
  Filled 2018-05-27 (×3): qty 2

## 2018-05-27 NOTE — Progress Notes (Addendum)
Initial Nutrition Assessment  DOCUMENTATION CODES:   Severe malnutrition in context of chronic illness  INTERVENTION:  Provided Nutrition Education handouts:  Nutritional Considerations for People with Head and Neck Cancer and Tips for Well-being  While pt is on clear liquids -provide protein modular 60 ml BID (400 kcal , 60 gr protein).  When diet is advanced to full liquids RD will trial of Vital Shakes (500 kcal, 22 gr protein) each. If he is able to drink these we could meet his est needs while he is waiting for jtube.   If patient will not consume then he may need TPN to be initiated.  Recommend consider Palliative Medicine consult   MVI daily   NUTRITION DIAGNOSIS:   Severe Malnutrition related to chronic illness, inability to eat, altered GI function(esophogeal adenocarcinoma, unable to tolerate solid foods per pt and has constant build up of salvia which he expectorates in the trash can.) as evidenced by severe muscle depletion, severe fat depletion, energy intake < or equal to 75% for > or equal to 1 month, per patient/family report, percent weight loss.   GOAL:  Patient will meet greater than or equal to 90% of their needs(-if feasible given his compromised nutrition status- at high risk for refeeding )  MONITOR:  Diet advancement, Supplement acceptance, Labs, Weight trends(J tube placement-scheduling decision)  REASON FOR ASSESSMENT:   Malnutrition Screening Tool, Consult Assessment of nutrition requirement/status  ASSESSMENT:  Patient is a 67 yo male who has a hx of GERD and poor tolerance of oral intake. Patient says he vomited last night but has not vomited today. He was recently diagnosed  with esophogeal adenocarcinoma. The patient was to have a biopsy of his lung mass and Jtube placed on 6/19 but pt says he was too weak to go.  According to surgery note patient will have jtube placed next Wednesday or Friday (approximately 1 week from today). This delay is  concerning given his poor nutritional status. RD questions how motivated he is to have tube placed and if pt may benefit from a visit with palliative medicine team. His demeanor and response to questions (seem half-hearted).  Patient says at home has been drinking milk (sweet and chocolate), drinking cokes and eating cream of chicken soup. He is not forthcoming with details. Offered patient multiple options/ideas to increase intake at home. He says "I am burned out" on Ensure and Boost. His breakfast tray is here- he drank about 1-2 oz of grape juice and a couple of sips of broth. He has not been taking solid foods. Emphasized to him the importance of fluid intak, his goal range per day and that the IV fluids he is currently receiving is only a temporary fix to his hydration needs. Labs noted below- mag and phos are WNL.  Patient weight loss is severe -13% in the past month and 40% loss in the past 3 months. According to patient his usual weight is around 150 lb. He has severe muscle and fat loss in addition to unplanned weight loss.   Labs reviewed: BMP Latest Ref Rng & Units 05/26/2018 04/06/2018 04/05/2018  Glucose 70 - 99 mg/dL 98 105(H) 106(H)  BUN 8 - 23 mg/dL 26(H) 30(H) 33(H)  Creatinine 0.61 - 1.24 mg/dL 0.81 0.86 1.14  Sodium 135 - 145 mmol/L 141 150(H) 148(H)  Potassium 3.5 - 5.1 mmol/L 4.1 4.0 4.4  Chloride 98 - 111 mmol/L 105 113(H) 107  CO2 22 - 32 mmol/L 28 27 26   Calcium 8.9 -  10.3 mg/dL 9.2 9.7 10.4(H)    Medications reviewed and include: Boost Breeze TID, IV fluids NS @ 75 ml/hr.  NUTRITION - FOCUSED PHYSICAL EXAM:    Most Recent Value  Orbital Region  Moderate depletion  Upper Arm Region  Severe depletion  Thoracic and Lumbar Region  Severe depletion  Buccal Region  Moderate depletion  Temple Region  Moderate depletion  Clavicle Bone Region  Severe depletion  Clavicle and Acromion Bone Region  Severe depletion  Scapular Bone Region  Severe depletion  Dorsal Hand  Severe  depletion  Patellar Region  Severe depletion  Anterior Thigh Region  Severe depletion  Posterior Calf Region  Severe depletion  Edema (RD Assessment)  None  Hair  Reviewed  Eyes  Reviewed  Mouth  Reviewed  Skin  Reviewed  Nails  Reviewed      Diet Order:   Diet Order           Diet clear liquid Room service appropriate? Yes; Fluid consistency: Thin  Diet effective now          EDUCATION NEEDS:  Education needs have been addressed(handouts provided and discussed as noted above)   Skin:  Skin Assessment: Reviewed RN Assessment  Last BM:  7/20  Height:   Ht Readings from Last 1 Encounters:  05/26/18 5\' 8"  (1.727 m)    Weight:   Wt Readings from Last 1 Encounters:  05/26/18 90 lb 9.6 oz (41.1 kg)    Ideal Body Weight:  70 kg  BMI:  Body mass index is 13.78 kg/m.  Estimated Nutritional Needs:   Kcal:  1435-1640 (35-40 kcal/kg)  Protein:  70-78 gr (1,7-1.9 gr/kg)  Fluid:  >1400 ml daily fluid  Colman Cater MS,RD,CSG,LDN Office: [phone] Pager: [phone]

## 2018-05-27 NOTE — Consult Note (Signed)
[facility] Consult  Reason for Consult: Feeding Tube Referring Physician:  Dr. Dyann Schwartz  Chief Complaint    Failure To Thrive      Joshua Schwartz is a 67 y.o. male.  HPI: Joshua Schwartz is a 67 yo with a history of a newly diagnosed esophageal adenocarcinoma and a separate right upper lobe mass likely that is a second primary lung cancer who also has hilar adenopathy and additional nodules in the lungs who has represented to the hospital with failure to thrive and severe malnutrition.  Over several months he has lost > 60 lbs and is down to 90 lbs or so now. He was seen by my partner, Joshua Schwartz a few weeks ago and there was some discussion of a jejunostomy tube but the patient was tolerating some liquids and need to get his lung biopsy.  Since that time the patient has missed multiple appointments due to inability to get out of bed, and has not made it to see Joshua Schwartz or the lung biopsy by IR. He then wished to transfer his surgical care to [facility]? Per the Oncology documentation.    Since that time, he has been mostly in bed, and has been suffering with spitting up constantly. He is unable to eat or drink.  He came to the Ed with some slight dehydration with elevated BUN.    He was ready to get his feeding tube and per reports thought a feeding tube would help his symptoms of spitting, etc. Joshua Schwartz has discussed with him today that this is not the case, and he has tried to discuss some goals of care.   Past Medical History:  Diagnosis Date  . Esophageal mass   . GERD (gastroesophageal reflux disease)    denies chronic history  . HOH (hard of hearing)   . Hypertension     Past Surgical History:  Procedure Laterality Date  . ESOPHAGOGASTRODUODENOSCOPY N/A 04/06/2018   Procedure: ESOPHAGOGASTRODUODENOSCOPY (EGD);  Surgeon: Danie Binder, MD;  Location: [facility];  Service: Endoscopy;  Laterality: N/A;  possible dilation   . EYE SURGERY     as a child:  artifical tear ducts    Family History  Problem Relation Age of Onset  . Colon cancer Neg Hx   . Colon polyps Neg Hx     Social History   Tobacco Use  . Smoking status: Former Smoker    Years: 45.00    Last attempt to quit: 09/04/2015    Years since quitting: 2.7  . Smokeless tobacco: Never Used  . Tobacco comment: contiues to vape intermittently   Substance Use Topics  . Alcohol use: Not Currently    Frequency: Never  . Drug use: Never    Medications:  I have reviewed the patient's current medications. Prior to Admission:  Medications Prior to Admission  Medication Sig Dispense Refill Last Dose  . acetaminophen (TYLENOL) 500 MG tablet Take 500 mg by mouth every 6 (six) hours as needed for mild pain or moderate pain.   05/26/2018 at Unknown time  . amLODipine (NORVASC) 5 MG tablet Take 1 tablet (5 mg total) by mouth daily. 30 tablet 1 05/26/2018 at Unknown time  . Amino Acids-Protein Hydrolys (FEEDING SUPPLEMENT, PRO-STAT SUGAR FREE 64,) LIQD Take 30 mLs by mouth 2 (two) times daily. (Patient not taking: Reported on 04/21/2018) 900 mL 0 Not Taking  . nicotine (NICODERM CQ - DOSED IN MG/24 HOURS) 14 mg/24hr patch Place 1 patch (14 mg  total) onto the skin daily. (Patient not taking: Reported on 04/21/2018) 14 patch 0 Not Taking  . omeprazole (PRILOSEC) 20 MG capsule Take 1 capsule (20 mg total) by mouth 2 (two) times daily. (Patient not taking: Reported on 04/21/2018) 60 capsule 0 Not Taking   Scheduled: . enoxaparin (LOVENOX) injection  30 mg Subcutaneous Q24H  . feeding supplement  1 Container Oral TID BM  . feeding supplement (PRO-STAT SUGAR FREE 64)  60 mL Oral BID BM  . multivitamin  15 mL Oral Daily   Continuous: . sodium chloride 75 mL/hr at 05/27/18 1352   FXT:[id number], ondansetron (ZOFRAN) IV  Allergies  Allergen Reactions  . Codeine     Altered mental status  . Omeprazole Nausea And Vomiting      ROS:  A comprehensive review of systems was negative  except for: Constitutional: positive for fatigue and weight loss Respiratory: positive for cough, sputum and no hematemesis or hemoptysis  Gastrointestinal: positive for constipation  Blood pressure (!) 149/100, pulse 72, temperature 97.9 F (36.6 C), temperature source Oral, resp. rate (!) 21, height 5' 8" (1.727 m), weight 90 lb 9.6 oz (41.1 kg), SpO2 100 %. Physical Exam  Constitutional: He is oriented to person, place, and time. He appears cachectic. He has a sickly appearance. No distress.  HENT:  Temporal wasting, sunken in eyes  Eyes: Pupils are equal, round, and reactive to light.  Neck: Normal range of motion.  Cardiovascular: Normal rate.  Pulmonary/Chest: Effort normal.  Abdominal: Soft.  scaphoid abdomen, no scars, nontender  Musculoskeletal: Normal range of motion. He exhibits no edema.  Thin frail arms and legs  Neurological: He is alert and oriented to person, place, and time.  Skin: Skin is warm and dry.  Psychiatric: He has a normal mood and affect. His behavior is normal. Judgment and thought content normal.    Results: Results for orders placed or performed during the hospital encounter of 05/26/18 (from the past 48 hour(s))  Urinalysis, Routine w reflex microscopic     Status: Abnormal   Collection Time: 05/26/18  4:28 PM  Result Value Ref Range   Color, Urine YELLOW YELLOW   APPearance CLEAR CLEAR   Specific Gravity, Urine 1.020 1.005 - 1.030   pH 5.0 5.0 - 8.0   Glucose, UA NEGATIVE NEGATIVE mg/dL   Hgb urine dipstick NEGATIVE NEGATIVE   Bilirubin Urine NEGATIVE NEGATIVE   Ketones, ur 20 (A) NEGATIVE mg/dL   Protein, ur NEGATIVE NEGATIVE mg/dL   Nitrite NEGATIVE NEGATIVE   Leukocytes, UA NEGATIVE NEGATIVE    Comment: Performed at [facility], [address] [place], [place] [address]  Comprehensive metabolic panel     Status: Abnormal   Collection Time: 05/26/18  4:49 PM  Result Value Ref Range   Sodium 141 135 - 145 mmol/L   Potassium 4.1 3.5 - 5.1  mmol/L   Chloride 105 98 - 111 mmol/L   CO2 28 22 - 32 mmol/L   Glucose, Bld 98 70 - 99 mg/dL   BUN 26 (H) 8 - 23 mg/dL   Creatinine, Ser 0.81 0.61 - 1.24 mg/dL   Calcium 9.2 8.9 - 10.3 mg/dL   Total Protein 6.2 (L) 6.5 - 8.1 g/dL   Albumin 2.8 (L) 3.5 - 5.0 g/dL   AST 28 15 - 41 U/L   ALT 40 0 - 44 U/L   Alkaline Phosphatase 200 (H) 38 - 126 U/L   Total Bilirubin 0.8 0.3 - 1.2 mg/dL   GFR calc  non Af Amer >60 >60 mL/min   GFR calc Af Amer >60 >60 mL/min    Comment: (NOTE) The eGFR has been calculated using the CKD EPI equation. This calculation has not been validated in all clinical situations. eGFR's persistently <60 mL/min signify possible Chronic Kidney Disease.    Anion gap 8 5 - 15    Comment: Performed at [facility], [address], [place], [place] [address]  CBC with Differential     Status: Abnormal   Collection Time: 05/26/18  4:49 PM  Result Value Ref Range   WBC 11.4 (H) 4.0 - 10.5 K/uL   RBC 3.91 (L) 4.22 - 5.81 MIL/uL   Hemoglobin 12.4 (L) 13.0 - 17.0 g/dL   HCT 38.2 (L) 39.0 - 52.0 %   MCV 97.7 78.0 - 100.0 fL   MCH 31.7 26.0 - 34.0 pg   MCHC 32.5 30.0 - 36.0 g/dL   RDW 13.5 11.5 - 15.5 %   Platelets 235 150 - 400 K/uL   Neutrophils Relative % 84 %   Neutro Abs 9.5 (H) 1.7 - 7.7 K/uL   Lymphocytes Relative 6 %   Lymphs Abs 0.7 0.7 - 4.0 K/uL   Monocytes Relative 8 %   Monocytes Absolute 0.9 0.1 - 1.0 K/uL   Eosinophils Relative 2 %   Eosinophils Absolute 0.2 0.0 - 0.7 K/uL   Basophils Relative 0 %   Basophils Absolute 0.0 0.0 - 0.1 K/uL    Comment: Performed at [facility], [address] [place], [place] [address]  Magnesium     Status: None   Collection Time: 05/26/18  4:49 PM  Result Value Ref Range   Magnesium 2.0 1.7 - 2.4 mg/dL    Comment: Performed at [facility], [address], [place], [place] [address]  Phosphorus     Status: None   Collection Time: 05/26/18  4:49 PM  Result Value Ref Range   Phosphorus 3.6 2.5 - 4.6 mg/dL     Comment: Performed at [facility], [address] [place], [place] [address]   Reviewed CT chest/ap and PET scan  - Mass in the distal esophagus, hilar nodes, and right upper lung lesion  PET 04/2018  IMPRESSION: 1. Markedly hypermetabolic circumferential wall thickening in the distal esophagus, concerning for neoplasm. Esophagus prior to the lesion is distended and fluid-filled suggesting that the distal esophageal lesion obstructs normal esophageal transit. 2. Dominant right upper lobe lung mass measured on the prior diagnostic CT at 5 cm is hypermetabolic consistent with malignancy. Lesion shows central necrosis. This is likely a primary lung cancer although metastatic disease is possible. 3. Numerous ill-defined bilateral pulmonary nodules, some of which approach ground-glass attenuation. The show low level FDG accumulation in given their small size, raise concern for metastatic disease. Hypermetabolic metastatic disease in the right hilum. 4.  Aortic Atherosclerois (ICD10-170.0) 5. Ascending thoracic aortic aneurysm. Recommend semi-annual imaging followup by CTA or MRA and referral to cardiothoracic surgery if not already obtained. This recommendation follows 2010 ACCF/AHA/AATS/ACR/ASA/SCA/SCAI/SIR/STS/SVM Guidelines for the Diagnosis and Management of Patients With Thoracic Aortic Disease. Circulation. 2010; 121: I696-E952.  Dg Abd Acute W/chest  Result Date: 05/26/2018 CLINICAL DATA:  Vomiting. EXAM: DG ABDOMEN ACUTE W/ 1V CHEST COMPARISON:  PET-CT dated April 19, 2018. FINDINGS: The heart size and mediastinal contours are within normal limits. Normal pulmonary vascularity. Grossly unchanged right upper lobe mass and right hilar lymphadenopathy. No focal consolidation, pleural effusion, or pneumothorax. There is no evidence of dilated bowel loops or free  intraperitoneal air. No radiopaque calculi or other significant radiographic abnormality is seen. No acute osseous abnormality.  IMPRESSION: 1. Negative abdominal radiographs. 2. Grossly unchanged right upper lobe lung mass and right hilar lymphadenopathy. 3.  No active cardiopulmonary disease. Electronically Signed   By: Titus Dubin M.D.   On: 05/26/2018 17:20    Assessment & Plan:  Joshua Schwartz is a 67 y.o. male with likely 2 primary cancers - lung cancer and esophageal cancer. He has not had the lung biopsy at this time, but based on the imaging it is very likely.  He also has hilar adenopathy and lung nodules which could be from either primary.  The patient is frail and thin and cachetic.  He has lost significant weight in a few months.  He has his friend Joshua Schwartz at the bedside but no family in the picture. He has been to seen Oncology who discussed needing a PET and lung biopsy but no real discussion about chemotherapy / radiation or what all that entails and the complications related to these things.   -Discussed the 2 cancers and the spread of 2 cancers. Discussed the normal treatment options for both. Discussed the patient's current functional status, staying in bed all day and his weight loss and frailty. Discussed that I do not think he will likely tolerate those treatments given his current status.    -Discussed that I do not think he will ever be a surgical candidate for his lung cancer or esophagus cancer to be removed. Discussed that a Jejunostomy tube is usually done in esophageal cancer due to needing the stomach for surgery, but that this is not likely to ever be an option for him. Discussed that gastrostomy tubes are less problematic and if decided may be a better option.  Discussed that this would only give him some nutrition and hydration but would not help his symptoms of spitting or pain.  -I did not at the time realize he did have some pyloric stenosis on the EGD,  I still would favor a GJ tube over a jejunostomy due to the issues with malfunction and the overall difficulty and trouble of the jejunostomy tube.   -Discussed spit fistula for the spitting and bring  Esophagus out of the neck. I do not do this procedure but we discussed. He is not interested.  -Discussed that any surgery is risky in him due to this malnutrition, frailty, lung disease/ cancer, and overall strain on his body.   -Discussed that he does not have to pursue any of these options. Discussed DNR and what CPR and a breathing tube would mean. We discussed that if he got CPR and a breathing tube that he would potentially not get off the breathing tube and would need a tracheostomy.    -Discussed that he can pursue comfort options and not do any treatment or any feeding tube access.  Discussed that this is a personal decision but that he needs all the facts.  Discussed that Palliative Care, Joshua Schwartz is coming tomorrow to talk more.  Discussed medications that can help with symptoms like pain and spitting.   -Discussed that Joshua Schwartz is a friend and has no legal rights. Discussed getting social workers to help with POA paper work if possible.  -He has made no decisions, but has more information now. He will think about things tonight and speak with Joshua Schwartz tomorrow.   All questions were answered to the satisfaction of the patient and family.  Virl Cagey  05/27/2018, 5:08 PM

## 2018-05-27 NOTE — Progress Notes (Signed)
[facility]  In clinic this AM. Will see patient later today. Had informed ED earliest Jtube likely possible is next Wednesday or next Friday. Will see patient and discuss.   Curlene Labrum, MD [facility] [address] [place], [place] [address] [phone] (office)

## 2018-05-27 NOTE — Progress Notes (Signed)
PROGRESS NOTE    Joshua Schwartz  GGE:[id number] DOB: 07-08-1951 DOA: 05/26/2018 PCP: Patient, No Pcp Per    Brief Narrative:  67 y.o. male with medical history significant of esophageal adenocarcinoma recently diagnosed, GERD, tobacco abuse, comes in for poor po intake, unable to take food orally, worsening weight loss for over a year,. He also reports nausea, constant spitting of food and saliva and occasional vomiting. He denies fever or chills, reports some chest pressure on deep breathing and sob  Assessment & Plan: 1-failure to thrive/severe protein calorie malnutrition  -unable to eat or drink properly in the setting of esophageal adenocarcinoma. -planning to place G or J tube by general surgery to assist with nutrition and hydration. -will use full liquid diet and follow rec's by nutritional service on feeding supplements.  2-lung mass -as seen on PET scan -schedule for biopsy to establish proper diagnosis   3-GERD -continue PPI  4-nausea/vomiting -will continue PRN antiemetics  5-hx of tobacco abuse -patient quit 2 years ago. -no SOB or wheezing currently   DVT prophylaxis: lovenox. Code Status: full Family Communication: significant other at bedside. Disposition Plan: discussed with palliative care, continue IVF's, follow electrolytes and replete them as needed. Overall with very poor prognosis.   Consultants:   General surgery  Palliative care  Procedures:   See below for x-ray reports  Antimicrobials:  Anti-infectives (From admission, onward)   None      Subjective: Afebrile, chronically ill in appearance and very cachetic. No CP or SOB.  Objective: Vitals:   05/26/18 1929 05/26/18 2000 05/26/18 2049 05/27/18 0519  BP: (!) 180/99 (!) 186/99 (!) 161/92 (!) 149/100  Pulse: 86 79 73 72  Resp: (!) 21     Temp:   (!) 97.4 F (36.3 C) 97.9 F (36.6 C)  TempSrc:   Oral Oral  SpO2: 100% 100% 100% 100%  Weight:      Height:        Intake/Output  Summary (Last 24 hours) at 05/27/2018 1657 Last data filed at 05/27/2018 1559 Gross per 24 hour  Intake 2657.5 ml  Output -  Net 2657.5 ml   Filed Weights   05/26/18 1608  Weight: 41.1 kg (90 lb 9.6 oz)    Examination: General exam: Alert, awake, oriented x 3; cachetic, frail, chronically ill. Reports being in distress with constant spitting, intermittent abd discomfort and inability to eat or drink.  Respiratory system: Clear to auscultation. Respiratory effort normal. Cardiovascular system:RRR. No murmurs, rubs, gallops. Gastrointestinal system: Abdomen is nondistended, soft and No organomegaly or masses felt. Normal bowel sounds heard. Central nervous system: Alert and oriented. No focal neurological deficits. Extremities: No C/C/E, +pedal pulses Skin: No open wounds, no rashes or petechiae Psychiatry: Judgement and insight appear normal. Mood & affect appropriate.   Data Reviewed: I have personally reviewed following labs and imaging studies  CBC: Recent Labs  Lab 05/26/18 1649  WBC 11.4*  NEUTROABS 9.5*  HGB 12.4*  HCT 38.2*  MCV 97.7  PLT 465   Basic Metabolic Panel: Recent Labs  Lab 05/26/18 1649  NA 141  K 4.1  CL 105  CO2 28  GLUCOSE 98  BUN 26*  CREATININE 0.81  CALCIUM 9.2  MG 2.0  PHOS 3.6   GFR: Estimated Creatinine Clearance: 52.9 mL/min (by C-G formula based on SCr of 0.81 mg/dL).   Liver Function Tests: Recent Labs  Lab 05/26/18 1649  AST 28  ALT 40  ALKPHOS 200*  BILITOT 0.8  PROT 6.2*  ALBUMIN 2.8*   Urine analysis:    Component Value Date/Time   COLORURINE YELLOW 05/26/2018 1628   APPEARANCEUR CLEAR 05/26/2018 1628   LABSPEC 1.020 05/26/2018 1628   PHURINE 5.0 05/26/2018 1628   GLUCOSEU NEGATIVE 05/26/2018 1628   HGBUR NEGATIVE 05/26/2018 1628   BILIRUBINUR NEGATIVE 05/26/2018 1628   KETONESUR 20 (A) 05/26/2018 1628   PROTEINUR NEGATIVE 05/26/2018 1628   NITRITE NEGATIVE 05/26/2018 1628   LEUKOCYTESUR NEGATIVE 05/26/2018  1628    Radiology Studies: Dg Abd Acute W/chest  Result Date: 05/26/2018 CLINICAL DATA:  Vomiting. EXAM: DG ABDOMEN ACUTE W/ 1V CHEST COMPARISON:  PET-CT dated April 19, 2018. FINDINGS: The heart size and mediastinal contours are within normal limits. Normal pulmonary vascularity. Grossly unchanged right upper lobe mass and right hilar lymphadenopathy. No focal consolidation, pleural effusion, or pneumothorax. There is no evidence of dilated bowel loops or free intraperitoneal air. No radiopaque calculi or other significant radiographic abnormality is seen. No acute osseous abnormality. IMPRESSION: 1. Negative abdominal radiographs. 2. Grossly unchanged right upper lobe lung mass and right hilar lymphadenopathy. 3.  No active cardiopulmonary disease. Electronically Signed   By: Titus Dubin M.D.   On: 05/26/2018 17:20    Scheduled Meds: . enoxaparin (LOVENOX) injection  30 mg Subcutaneous Q24H  . feeding supplement  1 Container Oral TID BM  . feeding supplement (PRO-STAT SUGAR FREE 64)  60 mL Oral BID BM  . multivitamin  15 mL Oral Daily   Continuous Infusions: . sodium chloride 75 mL/hr at 05/27/18 1352     LOS: 0 days    Time spent: 35 minutes. Greater than 50% of this time was spent in direct contact with the patient, coordinating care and discussing relevant ongoing clinical issues, including treatment options, poor nutrition, best intention on enteral nutrition and expressing concerns for potential no candidacy for chemotherapy.     Barton Dubois, MD [facility] Pager [phone]  If 7PM-7AM, please contact night-coverage [email or web address] Password [facility] 05/27/2018, 4:57 PM

## 2018-05-27 NOTE — Care Management Obs Status (Signed)
[facility] NOTIFICATION   Patient Details  Name: Joshua Schwartz MRN: [id number] Date of Birth: September 16, 1951   Medicare Observation Status Notification Given:  Yes    Star Resler, Chauncey Reading, RN 05/27/2018, 11:05 AM

## 2018-05-27 NOTE — Evaluation (Signed)
Physical Therapy Evaluation Patient Details Name: Joshua Schwartz MRN: [id number] DOB: [birth year] Today's Date: 05/27/2018   History of Present Illness  Joshua Schwartz is a 67 y.o. male with medical history significant of esophageal adenocarcinoma recently diagnosed, GERD, tobacco abuse, comes in for poor po intake, unable to take food orally, worsening weight loss for over an year,. He also reports nausea, constant spitting of food and saliva and occasional vomiting. He denies fever or chills, reports some chest pressure on deep breathing and sob.He saw oncology Dr Delton Coombes on 6/19 and was scheduled to have a lung biopsy for the lung mass by IR and to see Dr Arnoldo Morale for a J TUBE placement, and he couldn't keep the appointment.     Clinical Impression  Patient functioning at baseline for functional mobility and gait.  Patient encouraged to ambulate ad lib in room and hallways as tolerated.  Plan:  Patient discharged from physical therapy to care of nursing for ambulation daily as tolerated for length of stay.    Follow Up Recommendations No PT follow up    Equipment Recommendations  None recommended by PT    Recommendations for Other Services       Precautions / Restrictions Precautions Precautions: None Restrictions Weight Bearing Restrictions: No      Mobility  Bed Mobility Overal bed mobility: Independent                Transfers Overall transfer level: Independent                  Ambulation/Gait Ambulation/Gait assistance: Modified independent (Device/Increase time) Gait Distance (Feet): 150 Feet   Gait Pattern/deviations: WFL(Within Functional Limits) Gait velocity: slightly decreased   General Gait Details: slightly slower than normal cadence without loss of balance on level, inclined, and declined surfaces  Stairs            Wheelchair Mobility    Modified Rankin (Stroke Patients Only)       Balance Overall balance assessment: No apparent  balance deficits (not formally assessed)                                           Pertinent Vitals/Pain Pain Assessment: No/denies pain    Home Living Family/patient expects to be discharged to:: Private residence Living Arrangements: Spouse/significant other Available Help at Discharge: Family Type of Home: Apartment Home Access: Stairs to enter   Technical brewer of Steps: 1 Home Layout: One level Home Equipment: Walker - standard;Shower seat;Grab bars - tub/shower      Prior Function Level of Independence: Independent         Comments: community ambulator, drives     Journalist, newspaper        Extremity/Trunk Assessment   Upper Extremity Assessment Upper Extremity Assessment: Overall WFL for tasks assessed    Lower Extremity Assessment Lower Extremity Assessment: Overall WFL for tasks assessed    Cervical / Trunk Assessment Cervical / Trunk Assessment: Kyphotic  Communication   Communication: No difficulties  Cognition Arousal/Alertness: Awake/alert Behavior During Therapy: WFL for tasks assessed/performed Overall Cognitive Status: Within Functional Limits for tasks assessed                                        General Comments  Exercises     Assessment/Plan    PT Assessment Patent does not need any further PT services  PT Problem List         PT Treatment Interventions      PT Goals (Current goals can be found in the Care Plan section)  Acute Rehab PT Goals Patient Stated Goal: return home PT Goal Formulation: With patient/family Time For Goal Achievement: 2018-06-07 Potential to Achieve Goals: Good    Frequency     Barriers to discharge        Co-evaluation               AM-PAC PT "6 Clicks" Daily Activity  Outcome Measure Difficulty turning over in bed (including adjusting bedclothes, sheets and blankets)?: None Difficulty moving from lying on back to sitting on the side of the  bed? : None Difficulty sitting down on and standing up from a chair with arms (e.g., wheelchair, bedside commode, etc,.)?: None Help needed moving to and from a bed to chair (including a wheelchair)?: None Help needed walking in hospital room?: None Help needed climbing 3-5 steps with a railing? : None 6 Click Score: 24    End of Session   Activity Tolerance: Patient tolerated treatment well Patient left: in bed;with call bell/phone within reach;with family/visitor present(seated at bedside) Nurse Communication: Mobility status PT Visit Diagnosis: Unsteadiness on feet (R26.81);Other abnormalities of gait and mobility (R26.89);Muscle weakness (generalized) (M62.81)    Time: 9093-1121 PT Time Calculation (min) (ACUTE ONLY): 22 min   Charges:   PT Evaluation $PT Eval Moderate Complexity: 1 Mod PT Treatments $Therapeutic Activity: 8-22 mins   PT G Codes:        8:36 AM, 06/07/2018 Joshua Schwartz, MPT Physical Therapist with [facility] [phone] office [phone] mobile phone

## 2018-05-28 ENCOUNTER — Other Ambulatory Visit: Payer: Self-pay | Admitting: Radiology

## 2018-05-28 ENCOUNTER — Encounter (HOSPITAL_COMMUNITY): Payer: Self-pay | Admitting: Primary Care

## 2018-05-28 DIAGNOSIS — Z515 Encounter for palliative care: Secondary | ICD-10-CM | POA: Diagnosis not present

## 2018-05-28 DIAGNOSIS — Z7189 Other specified counseling: Secondary | ICD-10-CM | POA: Diagnosis not present

## 2018-05-28 DIAGNOSIS — R531 Weakness: Secondary | ICD-10-CM | POA: Diagnosis not present

## 2018-05-28 DIAGNOSIS — C159 Malignant neoplasm of esophagus, unspecified: Secondary | ICD-10-CM | POA: Diagnosis not present

## 2018-05-28 DIAGNOSIS — K222 Esophageal obstruction: Secondary | ICD-10-CM | POA: Diagnosis not present

## 2018-05-28 DIAGNOSIS — R627 Adult failure to thrive: Secondary | ICD-10-CM | POA: Diagnosis not present

## 2018-05-28 DIAGNOSIS — R1310 Dysphagia, unspecified: Secondary | ICD-10-CM | POA: Diagnosis not present

## 2018-05-28 LAB — BASIC METABOLIC PANEL
ANION GAP: 7 (ref 5–15)
BUN: 18 mg/dL (ref 8–23)
CALCIUM: 8.9 mg/dL (ref 8.9–10.3)
CO2: 26 mmol/L (ref 22–32)
Chloride: 110 mmol/L (ref 98–111)
Creatinine, Ser: 0.67 mg/dL (ref 0.61–1.24)
GFR calc Af Amer: >60 mL/min (ref >60)
Glucose, Bld: 109 mg/dL — ABNORMAL HIGH (ref 70–99)
POTASSIUM: 3.8 mmol/L (ref 3.5–5.1)
Sodium: 143 mmol/L (ref 135–145)

## 2018-05-28 LAB — CBC
HEMATOCRIT: 34.7 % — AB (ref 39.0–52.0)
Hemoglobin: 11 g/dL — ABNORMAL LOW (ref 13.0–17.0)
MCH: 31.3 pg (ref 26.0–34.0)
MCHC: 31.7 g/dL (ref 30.0–36.0)
MCV: 98.9 fL (ref 78.0–100.0)
Platelets: 221 10*3/uL (ref 150–400)
RBC: 3.51 MIL/uL — AB (ref 4.22–5.81)
RDW: 13.7 % (ref 11.5–15.5)
WBC: 12.9 10*3/uL — AB (ref 4.0–10.5)

## 2018-05-28 MED ORDER — BISACODYL 10 MG RE SUPP
10.0000 mg | Freq: Every day | RECTAL | Status: DC | PRN
  Administered 2018-05-28: 10 mg via RECTAL
  Filled 2018-05-28: qty 1

## 2018-05-28 MED ORDER — DOCUSATE SODIUM 50 MG/5ML PO LIQD
100.0000 mg | Freq: Two times a day (BID) | ORAL | Status: DC | PRN
  Administered 2018-05-28: 100 mg via ORAL
  Filled 2018-05-28 (×2): qty 10

## 2018-05-28 MED ORDER — TRAMADOL HCL 50 MG PO TABS
50.0000 mg | ORAL_TABLET | Freq: Once | ORAL | Status: AC
  Administered 2018-05-28: 50 mg via ORAL
  Filled 2018-05-28: qty 1

## 2018-05-28 MED ORDER — SCOPOLAMINE 1 MG/3DAYS TD PT72
1.0000 | MEDICATED_PATCH | TRANSDERMAL | Status: DC
  Administered 2018-05-28: 1.5 mg via TRANSDERMAL
  Filled 2018-05-28: qty 1

## 2018-05-28 MED ORDER — ATROPINE SULFATE 1 % OP SOLN
2.0000 [drp] | Freq: Four times a day (QID) | OPHTHALMIC | Status: DC | PRN
  Filled 2018-05-28: qty 2

## 2018-05-28 MED ORDER — PROMETHAZINE HCL 25 MG/ML IJ SOLN
12.5000 mg | Freq: Four times a day (QID) | INTRAMUSCULAR | Status: DC | PRN
  Administered 2018-05-28: 12.5 mg via INTRAVENOUS
  Filled 2018-05-28: qty 1

## 2018-05-28 NOTE — Consult Note (Signed)
Consultation Note Date: 05/28/2018   Patient Name: Joshua Schwartz  DOB: 22-Feb-1951  MRN: [id number]  Age / Sex: 67 y.o., male  PCP: Patient, No Pcp Per Referring Physician: Barton Dubois, MD  Reason for Consultation: Establishing goals of care  HPI/Patient Profile: 67 y.o. 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   Clinical Assessment and Goals of Care: Joshua Schwartz is resting in the [place] chair in his room.  He greets me making and keeping eye contact.  He is alert, calm and cooperative.  Present today at bedside is his best friend Joshua Schwartz.  We talked about his discussion with hospitalist and surgeon related to placement of a feeding tube.  Joshua Schwartz states that after thought and consideration he has decided that he does not want PEG tube/tube feeding.  He states that he would like to continue managing as he is, doing the best he can.  I share with him that palliative medicine supports his autonomy, he decides what is right for him during this time.   We talked about healthcare power of attorney.  Joshua Schwartz states that he would like for his best friend, Joshua Schwartz, to make healthcare decisions if he is unable.  They have been very good friends since 2005, living in the same housing complex.  Face states that Joshua Schwartz helped her when she was sick, and she is ready to help him.  [name] states that she has been staying over at his home more, helping him with meals and housework.  I asked Joshua Schwartz if she is able to abide Mr. Doshi decisions about what he does and does not want.  She states that she is, stating, "it is his decision".  We  talked about symptom management including expectation of increased pain as time goes along.  Joshua Schwartz states that currently Tylenol is effective for managing his pain.  He is open to the use of narcotics if and when needed.  We talked about the side effect of constipation from narcotics.  Joshua Schwartz states that he has a BM every 4 to 5 days, he feels this is not frequently enough.  Liquid senna added, suppository offered.  Joshua Schwartz states that he does not want a feeding tube, no chemotherapy, no radiation, and no further biopsies.  We talked about expected increases in pain, and I ask if he has a primary care physician, who will care for his needs.  He has no PCP, and he suggests that possibly Joshua Schwartz in the cancer center would help him.  We talked about the benefits of in-home hospice services, a free service that can continue to treat the treatable, but no IV fluids, they will send nursing staff and aids to help with bathing when needed.  We talked about their expertise  and symptom management also.  After choices offered, Joshua Schwartz states he would like hospice of [facility] for in-home services.  Declines hospital bed at this time, would like suction set as he is finding it difficult to manage his oral secretions and has to spit frequently.  We talked about preferred place of death, home.  We talked about the possibility of hospice home in the future if Joshua Schwartz is unable to care for his needs.  Joshua Schwartz states that her brother passed away at residential hospice in 2014.  Conference with registered dietitian related to goals of care. Conference with chaplain Joya Gaskins related to plan of care, DNR status, advance directives. Conference with nursing staff related to plan of care, symptom management, suction set. Consultation to case management for in-home hospice services. Conference with hospitalist related to plan of care/disposition.  Health care power of attorney HCPOA -Joshua Schwartz names his "best friend",   Joshua Schwartz, as his healthcare surrogate decision-maker.  He states that he and Joshua Schwartz have been best friends since approximately 2005, they live in the same apartment complex.  He is not married, has no children.   SUMMARY OF RECOMMENDATIONS   No further biopsies, no chemo, no radiation. Home with hospice of [facility] for treat the treatable services (not full comfort care at this time).  Code Status/Advance Care Planning:  DNR -Joshua Schwartz elects to allow a natural death.  We discussed DNR, the goldenrod form.  I encouraged them to hang this form on his refrigerator.  Symptom Management:   Added scopolamine patch and atropine sublingual for increased oral secretions.  Suction set for increased oral secretions  We discussed the anticipation of increased pain, and how this is managed.  We discussed bowel regimen, orders made.  Palliative Prophylaxis:   Frequent Pain Assessment and Oral Care  Additional Recommendations (Limitations, Scope, Preferences):  No Artificial Feeding, No Chemotherapy, No Radiation and In-home hospice services for treat the treatable services.  Psycho-social/Spiritual:   Desire for further Chaplaincy support:yes  Additional Recommendations: Caregiving  Support/Resources and Education on Hospice  Prognosis:   < 6 weeks, or less would not be surprising based on frailty, declining functional status, weight loss, inability to take adequate nutrition.  Prognosis NOT discussed with patient today.  Discharge Planning: Home with the benefits of hospice of [facility]      Primary Diagnoses: Present on Admission: . Failure to thrive in adult . Lung mass . Protein-calorie malnutrition, severe . Tobacco abuse . Weight loss, abnormal . Intractable nausea and vomiting . GERD (gastroesophageal reflux disease) . Esophageal adenocarcinoma ([place])   I have reviewed the medical record, interviewed the patient and family, and examined the patient. The  following aspects are pertinent.  Past Medical History:  Diagnosis Date  . Esophageal mass   . GERD (gastroesophageal reflux disease)    denies chronic history  . HOH (hard of hearing)   . Hypertension    Social History   Socioeconomic History  . Marital status: Single    Spouse name: Not on file  . Number of children: Not on file  . Years of education: Not on file  . Highest education level: Not on file  Occupational History  . Not on file  Social Needs  . Financial resource strain: Not on file  . Food insecurity:    Worry: Not on file    Inability: Not on file  . Transportation needs:    Medical: Not on file    Non-medical: Not on  file  Tobacco Use  . Smoking status: Former Smoker    Years: 45.00    Last attempt to quit: 09/04/2015    Years since quitting: 2.7  . Smokeless tobacco: Never Used  . Tobacco comment: contiues to vape intermittently   Substance and Sexual Activity  . Alcohol use: Not Currently    Frequency: Never  . Drug use: Never  . Sexual activity: Not on file  Lifestyle  . Physical activity:    Days per week: Not on file    Minutes per session: Not on file  . Stress: Not on file  Relationships  . Social connections:    Talks on phone: Not on file    Gets together: Not on file    Attends religious service: Not on file    Active member of club or organization: Not on file    Attends meetings of clubs or organizations: Not on file    Relationship status: Not on file  Other Topics Concern  . Not on file  Social History Narrative   One son, has not seen in some time. Has a girlfriend who lives in his apartment complex. Lives alone.    Family History  Problem Relation Age of Onset  . Colon cancer Neg Hx   . Colon polyps Neg Hx    Scheduled Meds: . feeding supplement  1 Container Oral TID BM  . feeding supplement (PRO-STAT SUGAR FREE 64)  60 mL Oral BID BM  . multivitamin  15 mL Oral Daily  . scopolamine  1 patch Transdermal Q72H    Continuous Infusions: . sodium chloride 75 mL/hr at 05/28/18 1015   PRN Meds:.acetaminophen, atropine, bisacodyl, docusate, ondansetron (ZOFRAN) IV Medications Prior to Admission:  Prior to Admission medications   Medication Sig Start Date End Date Taking? Authorizing Provider  acetaminophen (TYLENOL) 500 MG tablet Take 500 mg by mouth every 6 (six) hours as needed for mild pain or moderate pain.   Yes [provider]  amLODipine (NORVASC) 5 MG tablet Take 1 tablet (5 mg total) by mouth daily. 04/08/18 05/26/18 Yes Johnson, Clanford L, MD  Amino Acids-Protein Hydrolys (FEEDING SUPPLEMENT, PRO-STAT SUGAR FREE 64,) LIQD Take 30 mLs by mouth 2 (two) times daily. Patient not taking: Reported on 04/21/2018 04/08/18   Irwin Brakeman L, MD  nicotine (NICODERM CQ - DOSED IN MG/24 HOURS) 14 mg/24hr patch Place 1 patch (14 mg total) onto the skin daily. Patient not taking: Reported on 04/21/2018 04/09/18   Murlean Iba, MD  omeprazole (PRILOSEC) 20 MG capsule Take 1 capsule (20 mg total) by mouth 2 (two) times daily. Patient not taking: Reported on 04/21/2018 04/04/18   Tanna Furry, MD   Allergies  Allergen Reactions  . Codeine     Altered mental status  . Omeprazole Nausea And Vomiting   Review of Systems  Unable to perform ROS: Other    Physical Exam  Constitutional: He is oriented to person, place, and time. No distress.  HENT:  Head: Atraumatic.  Temporal wasting  Cardiovascular: Normal rate.  Pulmonary/Chest: Effort normal. No respiratory distress.  Abdominal: Soft. He exhibits no distension.  Musculoskeletal: He exhibits no edema.  Cachectic  Neurological: He is alert and oriented to person, place, and time.  Skin: Skin is warm and dry.  Psychiatric:  Calm and cooperative, appropriate  Nursing note and vitals reviewed.   Vital Signs: BP (!) 152/91 (BP Location: Left Arm)   Pulse (!) 56   Temp 98.2 F (36.8 C) (  Oral)   Resp 18   Ht 5\' 8"  (1.727 m)   Wt 41.1 kg  (90 lb 9.6 oz)   SpO2 100%   BMI 13.78 kg/m  Pain Scale: 0-10   Pain Score: 5    SpO2: SpO2: 100 % O2 Device:SpO2: 100 % O2 Flow Rate: .O2 Flow Rate (L/min): 2 L/min  IO: Intake/output summary:   Intake/Output Summary (Last 24 hours) at 05/28/2018 1232 Last data filed at 05/28/2018 0900 Gross per 24 hour  Intake 2805 ml  Output 152 ml  Net 2653 ml    LBM: Last BM Date: 05/22/18 Baseline Weight: Weight: 41.1 kg (90 lb 9.6 oz) Most recent weight: Weight: 41.1 kg (90 lb 9.6 oz)     Palliative Assessment/Data:   Flowsheet Rows     Most Recent Value  Intake Tab  Referral Department  Hospitalist  Unit at Time of Referral  Med/Surg Unit  Palliative Care Primary Diagnosis  Cancer  Date Notified  05/27/18  Palliative Care Type  New Palliative care  Reason for referral  Clarify Goals of Care  Date of Admission  05/26/18  Date first seen by Palliative Care  05/28/18  # of days Palliative referral response time  1 Day(s)  # of days Schwartz prior to Palliative referral  1  Clinical Assessment  Palliative Performance Scale Score  40%  Pain Max last 24 hours  Not able to report  Pain Min Last 24 hours  Not able to report  Dyspnea Max Last 24 Hours  Not able to report  Dyspnea Min Last 24 hours  Not able to report  Psychosocial & Spiritual Assessment  Palliative Care Outcomes  Patient/Family meeting held?  Yes  Who was at the meeting?  Patient and best friend Joshua Schwartz at bedside.  Palliative Care Outcomes  Clarified goals of care, Provided psychosocial or spiritual support, Changed CPR status, Completed durable DNR, Provided advance care planning, Counseled regarding hospice  Patient/Family wishes: Interventions discontinued/not started   Mechanical Ventilation, PEG, Tube feedings/TPN      Time In: 1000 Time Out: 1130 Time Total: 90 minutes Greater than 50%  of this time was spent counseling and coordinating care related to the above assessment and plan.  Signed by: Drue Novel,  NP   Please contact Palliative Medicine Team phone at [phone] for questions and concerns.  For individual provider: See Shea Evans

## 2018-05-28 NOTE — Progress Notes (Signed)
PROGRESS NOTE    Joshua Schwartz  JKK:[id number] DOB: 1950-11-27 DOA: 05/26/2018 PCP: Patient, No Pcp Per    Brief Narrative:  67 y.o. male with medical history significant of esophageal adenocarcinoma recently diagnosed, GERD, tobacco abuse, comes in for poor po intake, unable to take food orally, worsening weight loss for over a year,. He also reports nausea, constant spitting of food and saliva and occasional vomiting. He denies fever or chills, reports some chest pressure on deep breathing and sob  Assessment & Plan: 1-failure to thrive/severe protein calorie malnutrition  -unable to eat or drink properly in the setting of esophageal adenocarcinoma. -patient has opted not to pursuit any tube placement for artificial nutrition and will like to have only symptomatic management and comfort care. -continue scopolamine patch and PRN atropine drops for excessive secretion. -suctioning machine to be arranged by hospice at discharge.  -will use full liquid diet and follow rec's by nutritional service on feeding supplements. -home tomorrow with hospice care.   2-lung mass -as seen on PET scan -Patient has declined pursuing any biopsy at this moment and is looking to transition gears into full comfort and symptomatic management only. -Hospice has been consulted.  3-GERD -continue PPI  4-nausea/vomiting -will continue PRN antiemetics  5-hx of tobacco abuse -patient quit 2 years ago. -no SOB or wheezing currently   DVT prophylaxis: lovenox. Code Status: full Family Communication: significant other at bedside. Disposition Plan: Appreciate discussion by palliative care service with patient and significant other.  At this moment goals of care has been decided to pursued symptom management and comfort care. Patient is declining pursuing any type tubes placement for artificial nutrition, not interested in pursuing biopsies, chemotherapy or radiation therapy.   Consultants:   General  surgery  Palliative care  Hospice   Procedures:   See below for x-ray reports  Antimicrobials:  Anti-infectives (From admission, onward)   None      Subjective: No fever, chronically ill in appearance and very cachectic.  Denies chest pain or shortness of breath.  Patient in no acute distress currently.  Objective: Vitals:   05/27/18 1500 05/27/18 2008 05/27/18 2121 05/28/18 0528  BP: 136/82  (!) 152/88 (!) 152/91  Pulse: 71  (!) 58 (!) 56  Resp:   18 18  Temp: 97.6 F (36.4 C)  98 F (36.7 C) 98.2 F (36.8 C)  TempSrc: Oral  Oral Oral  SpO2: 100% 95% 99% 100%  Weight:      Height:        Intake/Output Summary (Last 24 hours) at 05/28/2018 1706 Last data filed at 05/28/2018 0900 Gross per 24 hour  Intake 1666.25 ml  Output 152 ml  Net 1514.25 ml   Filed Weights   05/26/18 1608  Weight: 41.1 kg (90 lb 9.6 oz)    Examination: General exam: Alert, awake, oriented x 3; chronically ill in appearance, cachectic and frail.  Reports some improvement in his symptoms with the use of suctioning device.  Still having intermittent discomfort in his abdomen.  Continued to have sips out of full liquid diet. Respiratory system: Clear to auscultation. Respiratory effort normal. Cardiovascular system:RRR. No murmurs, rubs, gallops. Gastrointestinal system: Abdomen is nondistended, soft and no organomegaly or masses felt. Normal bowel sounds heard. Central nervous system: Alert and oriented. No focal neurological deficits. Extremities: No C/C/E, +pedal pulses Skin: No rashes, lesions or ulcers Psychiatry: Judgement and insight appear normal. Mood & affect appropriate.    Data Reviewed: I have personally reviewed following  labs and imaging studies  CBC: Recent Labs  Lab 05/26/18 1649 05/28/18 0508  WBC 11.4* 12.9*  NEUTROABS 9.5*  --   HGB 12.4* 11.0*  HCT 38.2* 34.7*  MCV 97.7 98.9  PLT 235 681   Basic Metabolic Panel: Recent Labs  Lab 05/26/18 1649  05/28/18 0508  NA 141 143  K 4.1 3.8  CL 105 110  CO2 28 26  GLUCOSE 98 109*  BUN 26* 18  CREATININE 0.81 0.67  CALCIUM 9.2 8.9  MG 2.0  --   PHOS 3.6  --    GFR: Estimated Creatinine Clearance: 53.5 mL/min (by C-G formula based on SCr of 0.67 mg/dL).   Liver Function Tests: Recent Labs  Lab 05/26/18 1649  AST 28  ALT 40  ALKPHOS 200*  BILITOT 0.8  PROT 6.2*  ALBUMIN 2.8*   Urine analysis:    Component Value Date/Time   COLORURINE YELLOW 05/26/2018 1628   APPEARANCEUR CLEAR 05/26/2018 1628   LABSPEC 1.020 05/26/2018 1628   PHURINE 5.0 05/26/2018 1628   GLUCOSEU NEGATIVE 05/26/2018 1628   HGBUR NEGATIVE 05/26/2018 1628   BILIRUBINUR NEGATIVE 05/26/2018 1628   KETONESUR 20 (A) 05/26/2018 1628   PROTEINUR NEGATIVE 05/26/2018 1628   NITRITE NEGATIVE 05/26/2018 1628   LEUKOCYTESUR NEGATIVE 05/26/2018 1628    Radiology Studies: Dg Abd Acute W/chest  Result Date: 05/26/2018 CLINICAL DATA:  Vomiting. EXAM: DG ABDOMEN ACUTE W/ 1V CHEST COMPARISON:  PET-CT dated April 19, 2018. FINDINGS: The heart size and mediastinal contours are within normal limits. Normal pulmonary vascularity. Grossly unchanged right upper lobe mass and right hilar lymphadenopathy. No focal consolidation, pleural effusion, or pneumothorax. There is no evidence of dilated bowel loops or free intraperitoneal air. No radiopaque calculi or other significant radiographic abnormality is seen. No acute osseous abnormality. IMPRESSION: 1. Negative abdominal radiographs. 2. Grossly unchanged right upper lobe lung mass and right hilar lymphadenopathy. 3.  No active cardiopulmonary disease. Electronically Signed   By: Titus Dubin M.D.   On: 05/26/2018 17:20    Scheduled Meds: . feeding supplement  1 Container Oral TID BM  . feeding supplement (PRO-STAT SUGAR FREE 64)  60 mL Oral BID BM  . multivitamin  15 mL Oral Daily  . scopolamine  1 patch Transdermal Q72H   Continuous Infusions: . sodium chloride 75  mL/hr at 05/28/18 1015     LOS: 0 days    Time spent: 30 minutes    Barton Dubois, MD [facility] Pager [phone]  If 7PM-7AM, please contact night-coverage [email or web address] Password [facility] 05/28/2018, 5:06 PM

## 2018-05-28 NOTE — ACP (Advance Care Planning) (Signed)
Shared Advance Directives information with Mr Maya and his friend [name]. He stated he was tired and unable to complete but that he wanted [name] to speak for him if he could not. Offered emotional/spiritual support also.

## 2018-05-28 NOTE — Progress Notes (Signed)
Nutrition Follow up  DOCUMENTATION CODES:   Severe malnutrition in context of chronic illness  INTERVENTION:  If patient is accepting continue to provide protein modular 60 ml BID (400 kcal , 60 gr protein).  Encourage intake of Vital Shakes added to all meals (500 kcal, 22 gr protein) each.   MVI daily   NUTRITION DIAGNOSIS:   Severe Malnutrition related to chronic illness, inability to eat, altered GI function(esophogeal adenocarcinoma, unable to tolerate solid foods per pt and has constant build up of salvia which he expectorates in the trash can.) as evidenced by severe muscle depletion, severe fat depletion, energy intake < or equal to 75% for > or equal to 1 month, per patient/family report, percent weight loss.   GOAL:  Patient will meet greater than or equal to 90% of their needs(-if feasible given his compromised nutrition status- at high risk for refeeding )  MONITOR:  Diet advancement, Supplement acceptance, Labs, Weight trends(J tube placement-scheduling decision)  REASON FOR ASSESSMENT:   Malnutrition Screening Tool, Consult Assessment of nutrition requirement/status  ASSESSMENT:  Patient is a 67 yo male who has a hx of GERD and poor tolerance of oral intake. Patient says he vomited last night but has not vomited today. He was recently diagnosed  with esophogeal adenocarcinoma. The patient was to have a biopsy of his lung mass and Jtube placed on 6/19 but pt says he was too weak to go.  According to surgery note patient will have jtube placed next Wednesday or Friday (approximately 1 week from today). This delay is concerning given his poor nutritional status. RD questions how motivated he is to have tube placed and if pt may benefit from a visit with palliative medicine team. His demeanor and response to questions (seem half-hearted).  Patient says at home has been drinking milk (sweet and chocolate), drinking cokes and eating cream of chicken soup. He is not  forthcoming with details. Offered patient multiple options/ideas to increase intake at home. He says "I am burned out" on Ensure and Boost. His breakfast tray is here- he drank about 1-2 oz of grape juice and a couple of sips of broth. He has not been taking solid foods. Emphasized to him the importance of fluid intak, his goal range per day and that the IV fluids he is currently receiving is only a temporary fix to his hydration needs. Labs noted below- mag and phos are WNL.  Patient weight loss is severe -13% in the past month and 40% loss in the past 3 months. According to patient his usual weight is around 150 lb. He has severe muscle and fat loss in addition to unplanned weight loss.    7/26: Patient is up in chair this morning. He has 2 boost breeze cartons here and 60 ml of Prostat on his table. A male (his wife?) is here and says all he wants right now are the mango popcicles. Talked with pt briefly about trialing the Vital shakes then returned after lunch to observe intake. He is in the bed now asleep but male is still here and the Vital Shake is untouched. He had a 1 popcicle for lunch according to her but did not even taste the Vital for acceptance. Talked with palliative medicine who is also seeing him today. Will follow for results of their conversation and or direction of his care goals going forward.  Labs reviewed: BMP Latest Ref Rng & Units 05/28/2018 05/26/2018 04/06/2018  Glucose 70 - 99 mg/dL 109(H) 98 105(H)  BUN 8 - 23 mg/dL 18 26(H) 30(H)  Creatinine 0.61 - 1.24 mg/dL 0.67 0.81 0.86  Sodium 135 - 145 mmol/L 143 141 150(H)  Potassium 3.5 - 5.1 mmol/L 3.8 4.1 4.0  Chloride 98 - 111 mmol/L 110 105 113(H)  CO2 22 - 32 mmol/L 26 28 27   Calcium 8.9 - 10.3 mg/dL 8.9 9.2 9.7    Medications reviewed and include: Boost Breeze TID, IV fluids NS @ 75 ml/hr.  NUTRITION - FOCUSED PHYSICAL EXAM:    Most Recent Value  Orbital Region  Moderate depletion  Upper Arm Region  Severe  depletion  Thoracic and Lumbar Region  Severe depletion  Buccal Region  Moderate depletion  Temple Region  Moderate depletion  Clavicle Bone Region  Severe depletion  Clavicle and Acromion Bone Region  Severe depletion  Scapular Bone Region  Severe depletion  Dorsal Hand  Severe depletion  Patellar Region  Severe depletion  Anterior Thigh Region  Severe depletion  Posterior Calf Region  Severe depletion  Edema (RD Assessment)  None  Hair  Reviewed  Eyes  Reviewed  Mouth  Reviewed  Skin  Reviewed  Nails  Reviewed      Diet Order:   Diet Order           Diet full liquid Room service appropriate? Yes; Fluid consistency: Thin  Diet effective now          EDUCATION NEEDS:  Education needs have been addressed(handouts provided and discussed as noted above)   Skin:  Skin Assessment: Reviewed RN Assessment  Last BM:  7/20  Height:   Ht Readings from Last 1 Encounters:  05/26/18 5\' 8"  (1.727 m)    Weight:   Wt Readings from Last 1 Encounters:  05/26/18 90 lb 9.6 oz (41.1 kg)    Ideal Body Weight:  70 kg  BMI:  Body mass index is 13.78 kg/m.  Estimated Nutritional Needs:   Kcal:  1435-1640 (35-40 kcal/kg)  Protein:  70-78 gr (1,7-1.9 gr/kg)  Fluid:  >1400 ml daily fluid  Colman Cater MS,RD,CSG,LDN Office: [phone] Pager: [phone]

## 2018-05-28 NOTE — Progress Notes (Signed)
[facility]  Patient pursuing hospice. No plans for feeding tube.   Curlene Labrum, MD [facility] [address] [place], [place] [address] [phone] (office)

## 2018-05-29 DIAGNOSIS — I10 Essential (primary) hypertension: Secondary | ICD-10-CM | POA: Diagnosis present

## 2018-05-29 DIAGNOSIS — R918 Other nonspecific abnormal finding of lung field: Secondary | ICD-10-CM | POA: Diagnosis present

## 2018-05-29 DIAGNOSIS — Z888 Allergy status to other drugs, medicaments and biological substances status: Secondary | ICD-10-CM | POA: Diagnosis not present

## 2018-05-29 DIAGNOSIS — I712 Thoracic aortic aneurysm, without rupture: Secondary | ICD-10-CM | POA: Diagnosis present

## 2018-05-29 DIAGNOSIS — Z79899 Other long term (current) drug therapy: Secondary | ICD-10-CM | POA: Diagnosis not present

## 2018-05-29 DIAGNOSIS — C159 Malignant neoplasm of esophagus, unspecified: Secondary | ICD-10-CM | POA: Diagnosis present

## 2018-05-29 DIAGNOSIS — E43 Unspecified severe protein-calorie malnutrition: Secondary | ICD-10-CM | POA: Diagnosis present

## 2018-05-29 DIAGNOSIS — R1310 Dysphagia, unspecified: Secondary | ICD-10-CM | POA: Diagnosis not present

## 2018-05-29 DIAGNOSIS — H9190 Unspecified hearing loss, unspecified ear: Secondary | ICD-10-CM | POA: Diagnosis present

## 2018-05-29 DIAGNOSIS — R531 Weakness: Secondary | ICD-10-CM | POA: Diagnosis present

## 2018-05-29 DIAGNOSIS — Z681 Body mass index (BMI) 19 or less, adult: Secondary | ICD-10-CM | POA: Diagnosis not present

## 2018-05-29 DIAGNOSIS — K219 Gastro-esophageal reflux disease without esophagitis: Secondary | ICD-10-CM | POA: Diagnosis present

## 2018-05-29 DIAGNOSIS — R64 Cachexia: Secondary | ICD-10-CM | POA: Diagnosis present

## 2018-05-29 DIAGNOSIS — Z515 Encounter for palliative care: Secondary | ICD-10-CM | POA: Diagnosis not present

## 2018-05-29 DIAGNOSIS — R590 Localized enlarged lymph nodes: Secondary | ICD-10-CM | POA: Diagnosis present

## 2018-05-29 DIAGNOSIS — Z885 Allergy status to narcotic agent status: Secondary | ICD-10-CM | POA: Diagnosis not present

## 2018-05-29 DIAGNOSIS — R112 Nausea with vomiting, unspecified: Secondary | ICD-10-CM | POA: Diagnosis present

## 2018-05-29 DIAGNOSIS — F17290 Nicotine dependence, other tobacco product, uncomplicated: Secondary | ICD-10-CM | POA: Diagnosis present

## 2018-05-29 DIAGNOSIS — Z66 Do not resuscitate: Secondary | ICD-10-CM | POA: Diagnosis present

## 2018-05-29 DIAGNOSIS — R627 Adult failure to thrive: Secondary | ICD-10-CM | POA: Diagnosis present

## 2018-05-29 DIAGNOSIS — K222 Esophageal obstruction: Secondary | ICD-10-CM | POA: Diagnosis present

## 2018-05-29 MED ORDER — BISACODYL 10 MG RE SUPP: 10.0000 mg | Freq: Every day | RECTAL | 0 refills | Status: AC | PRN

## 2018-05-29 MED ORDER — MORPHINE SULFATE 20 MG/5ML PO SOLN: 2.5000 mg | ORAL | 0 refills | Status: AC | PRN

## 2018-05-29 MED ORDER — DOCUSATE SODIUM 50 MG/5ML PO LIQD: 100.0000 mg | Freq: Two times a day (BID) | ORAL | 0 refills | Status: AC | PRN

## 2018-05-29 MED ORDER — SCOPOLAMINE 1 MG/3DAYS TD PT72: 1.0000 | MEDICATED_PATCH | TRANSDERMAL | 2 refills | Status: AC

## 2018-05-29 MED ORDER — ONDANSETRON 8 MG PO TBDP: 8.0000 mg | ORAL_TABLET | Freq: Three times a day (TID) | ORAL | 0 refills | Status: AC | PRN

## 2018-05-29 MED ORDER — ATROPINE SULFATE 1 % OP SOLN: 2.0000 [drp] | Freq: Four times a day (QID) | OPHTHALMIC | 2 refills | Status: AC | PRN

## 2018-05-29 MED ORDER — PANTOPRAZOLE SODIUM 40 MG PO PACK: 20.0000 mg | PACK | Freq: Every day | ORAL | 1 refills | Status: AC

## 2018-05-29 NOTE — Discharge Summary (Signed)
Physician Discharge Summary  Joshua Schwartz OZD:[id number] DOB: [birth year] DOA: 05/26/2018  PCP: Patient, No Pcp Per  Admit date: 05/26/2018 Discharge date: 05/29/2018  Time spent: 35 minutes  Recommendations for Outpatient Follow-up:  1. Main goal is symptoms management and full comfort care. 2. No future hospitalization unless there is comfort care cannot be achieved outside the hospital.  Discharge Diagnoses:  Active Problems:   Tobacco abuse   GERD (gastroesophageal reflux disease)   Weight loss, abnormal   Intractable nausea and vomiting   Dysphagia   Protein-calorie malnutrition, severe   Esophageal stricture   Lung mass   Esophageal adenocarcinoma (HCC)   Failure to thrive in adult   DNR (do not resuscitate) discussion   Encounter for hospice care discussion   Goals of care, counseling/discussion   Palliative care by specialist   Generalized weakness   Discharge Condition: Stable and in no distress.  Patient discharged home with hospice.  Diet recommendation: Full liquid comfort feeding.  Filed Weights   05/26/18 1608  Weight: 41.1 kg (90 lb 9.6 oz)    Brief History of present illness:  67 y.o.malewith medical history significant ofesophageal adenocarcinoma recently diagnosed, GERD, tobacco abuse, comes in for poor po intake, unable to take food orally, worsening weight loss for over a year,. He also reports nausea, constant spitting of food and saliva and occasional vomiting. He denies fever or chills, reports some chest pressure on deep breathing and sob.  Hospital Course:  1-failure to thrive/severe protein calorie malnutrition  -unable to eat or drink properly in the setting of esophageal adenocarcinoma. -patient has opted not to pursuit any tube placement for artificial nutrition and will like to have only symptomatic management and comfort care. -continue scopolamine patch and PRN atropine drops for excessive secretion. -suctioning machine to be arranged by  hospice at discharge.  -will recommend comfort feeding; mainly full liquid diet (which he tolerates small amounts during hospitalization). -home with hospice care.   2-lung mass -as seen on PET scan -Patient has declined pursuing any biopsy at this moment and is looking to transition gears into full comfort and symptomatic management only. -Patient discharge home with Hospice. -prescription for morphine concentrate given.   3-GERD -continue PPI -formulation changed to solution to facilitate taking medication.  4-nausea/vomiting -will continue PRN antiemetics  5-hx of tobacco abuse -patient quit smoking 2 years ago. -no SOB or wheezing currently.  6-essential hypertension -Continue Norvasc while able to take it by mouth. -medication can be discontinued down the road; as main goal is comfort.  Procedures:  See below for x-ray reports.  Consultations:  General surgery   Palliative care  Hospice  Discharge Exam: Vitals:   05/29/18 0545 05/29/18 0900  BP: (!) 151/102 133/84  Pulse: (!) 56 66  Resp: 20 16  Temp: 98.6 F (37 C)   SpO2: 100% 99%    General exam: Alert, awake, oriented x 3; chronically ill in appearance, cachectic and very frail.  Reports some improvement in his symptoms with the use of suctioning device, atropine drops and scopolamine patch.  Still having intermittent discomfort in his abdomen, but expressed Tylenol and pain medication given are helping with symptom management.    Improved tolerance to full liquid diet. Respiratory system: Clear to auscultation. Respiratory effort normal. Cardiovascular system:RRR. No murmurs, rubs, gallops. Gastrointestinal system: Abdomen is nondistended, soft and no organomegaly or masses felt. Normal bowel sounds heard. Central nervous system: Alert and oriented. No focal neurological deficits. Extremities: No C/C/E, +pedal pulses Skin:  No rashes, lesions or ulcers Psychiatry: Judgement and insight appear  normal. Mood & affect appropriate.      Discharge Instructions   Discharge Instructions    Discharge instructions   Complete by:  As directed    Symptoms management and comfort care only. No further rehospitalization. Home Hospice. Comfort feeding and adequate hydration as tolerated.     Allergies as of 05/29/2018      Reactions   Codeine    Altered mental status   Omeprazole Nausea And Vomiting      Medication List    STOP taking these medications   nicotine 14 mg/24hr patch Commonly known as:  NICODERM CQ - dosed in mg/24 hours   omeprazole 20 MG capsule Commonly known as:  PRILOSEC     TAKE these medications   acetaminophen 500 MG tablet Commonly known as:  TYLENOL Take 500 mg by mouth every 6 (six) hours as needed for mild pain or moderate pain.   amLODipine 5 MG tablet Commonly known as:  NORVASC Take 1 tablet (5 mg total) by mouth daily.   atropine 1 % ophthalmic solution Place 2 drops under the tongue 4 (four) times daily as needed (increased oral secretions).   bisacodyl 10 MG suppository Commonly known as:  DULCOLAX Place 1 suppository (10 mg total) rectally daily as needed for moderate constipation.   docusate 50 MG/5ML liquid Commonly known as:  COLACE Take 10 mLs (100 mg total) by mouth 2 (two) times daily as needed for mild constipation.   feeding supplement (PRO-STAT SUGAR FREE 64) Liqd Take 30 mLs by mouth 2 (two) times daily.   morphine 20 MG/5ML solution Take 0.6 mLs (2.4 mg total) by mouth every 4 (four) hours as needed (severe pain).   ondansetron 8 MG disintegrating tablet Commonly known as:  ZOFRAN ODT Take 1 tablet (8 mg total) by mouth every 8 (eight) hours as needed for nausea or vomiting.   pantoprazole sodium 40 mg/20 mL Pack Commonly known as:  PROTONIX Take 10 mLs (20 mg total) by mouth daily.   scopolamine 1 MG/3DAYS Commonly known as:  TRANSDERM-SCOP Place 1 patch (1.5 mg total) onto the skin every 3 (three)  days. Start taking on:  05/31/2018            Durable Medical Equipment  (From admission, onward)        Start     Ordered   05/29/18 0753  For home use only DME Suction  Once    Question:  Suction  Answer:  Oral   05/29/18 0752     Allergies  Allergen Reactions  . Codeine     Altered mental status  . Omeprazole Nausea And Vomiting    The results of significant diagnostics from this hospitalization (including imaging, microbiology, ancillary and laboratory) are listed below for reference.    Significant Diagnostic Studies: Dg Abd Acute W/chest  Result Date: 05/26/2018 CLINICAL DATA:  Vomiting. EXAM: DG ABDOMEN ACUTE W/ 1V CHEST COMPARISON:  PET-CT dated April 19, 2018. FINDINGS: The heart size and mediastinal contours are within normal limits. Normal pulmonary vascularity. Grossly unchanged right upper lobe mass and right hilar lymphadenopathy. No focal consolidation, pleural effusion, or pneumothorax. There is no evidence of dilated bowel loops or free intraperitoneal air. No radiopaque calculi or other significant radiographic abnormality is seen. No acute osseous abnormality. IMPRESSION: 1. Negative abdominal radiographs. 2. Grossly unchanged right upper lobe lung mass and right hilar lymphadenopathy. 3.  No active cardiopulmonary disease. Electronically Signed  By: Titus Dubin M.D.   On: 05/26/2018 17:20    Microbiology: No results found for this or any previous visit (from the past 240 hour(s)).   Labs: Basic Metabolic Panel: Recent Labs  Lab 05/26/18 1649 05/28/18 0508  NA 141 143  K 4.1 3.8  CL 105 110  CO2 28 26  GLUCOSE 98 109*  BUN 26* 18  CREATININE 0.81 0.67  CALCIUM 9.2 8.9  MG 2.0  --   PHOS 3.6  --    Liver Function Tests: Recent Labs  Lab 05/26/18 1649  AST 28  ALT 40  ALKPHOS 200*  BILITOT 0.8  PROT 6.2*  ALBUMIN 2.8*   CBC: Recent Labs  Lab 05/26/18 1649 05/28/18 0508  WBC 11.4* 12.9*  NEUTROABS 9.5*  --   HGB 12.4* 11.0*   HCT 38.2* 34.7*  MCV 97.7 98.9  PLT 235 221    Signed:  Barton Dubois MD.  [facility] 05/29/2018, 11:54 AM

## 2018-05-29 NOTE — Progress Notes (Signed)
Pt IV removed, WNL. D/C instructions given to pt and spouse, verbalized understanding. Pt spouse at bedside to transport home.

## 2018-05-29 NOTE — Progress Notes (Signed)
Spoke with CSM, Claudie Leach, regarding hospice referral.  [facility] notified [facility].  Patient to have initial hospice evaluation this coming Monday.  Suction machine to be ordered today and delivered to patient's home later today from [place].  Dr. Dyann Kief aware and agreed with plan.

## 2018-05-29 NOTE — Care Management Note (Signed)
Case Management Note  Patient Details  Name: Joshua Schwartz MRN: [id number] Date of Birth: November 18, 1950  Subjective/Objective:           Pt to be discharged with home hospice.  Pt seen by Palliative Care yesterday and offered choice of agencies.  Pt chose [facility] and states the only DME he needs is suction for oral secretions.       Action/Plan: Spoke with Horris Latino of [facility]  No referral has been initiated.  Patient cannot be admitted over the weekend but can be admitted on Monday.  Spoke with RN and Dr. Dyann Kief, who states patient's symptoms are controlled and it is an acceptable plan to wait until Monday for Hospice care if he has suction.   [facility] uses [place] for DME.  Horris Latino and I agreed to have patient's suction delivered from [facility] today and billing can be switched over on Monday after the Hospice admission.    Information faxed to [facility] and they will deliver suction to patient's home today.      Expected Discharge Date:  05/29/18               Expected Discharge Plan:  [place]  In-House Referral:  NA  Discharge planning Services  CM Consult  Post Acute Care Choice:  Hospice Choice offered to:  Patient  DME Arranged:  Suction DME Agency:  [facility]  HH Arranged:  NA HH Agency:  [facility]  Status of Service:  Completed, signed off  If discussed at [employer], dates discussed:    Additional Comments:  Claudie Leach, RN 05/29/2018, 2:38 PM

## 2018-05-31 ENCOUNTER — Ambulatory Visit (HOSPITAL_COMMUNITY)
Admission: RE | Admit: 2018-05-31 | Discharge: 2018-05-31 | Disposition: A | Discharge status: Home / Self Care | Payer: Medicare Other | Source: Ambulatory Visit | Attending: Nurse Practitioner | Admitting: Nurse Practitioner

## 2018-05-31 ENCOUNTER — Encounter (HOSPITAL_COMMUNITY): Payer: Self-pay

## 2018-06-15 ENCOUNTER — Ambulatory Visit (HOSPITAL_COMMUNITY): Payer: Medicare Other | Admitting: Hematology

## 2018-07-04 DEATH — deceased

## 2019-06-07 IMAGING — MR MR HEAD WO/W CM
11 of 13 series · 32 of 48 positions shown · IV contrast (ml Multihance)
Comparison: Prior CT from 09/09/2007.

CLINICAL DATA: Initial evaluation for lung cancer staging.

EXAM:
MRI HEAD WITHOUT AND WITH CONTRAST
TECHNIQUE: Multiplanar, multiecho pulse sequences of the brain and surrounding
structures were obtained without and with intravenous contrast.
CONTRAST:  8mL MULTIHANCE GADOBENATE DIMEGLUMINE 529 MG/ML IV SOLN

[Series 2: T1 · sagittal · 5.0mm · 0.43mm/px · 2 of 21 slices shown (1 of 2)]
[im 1/21]
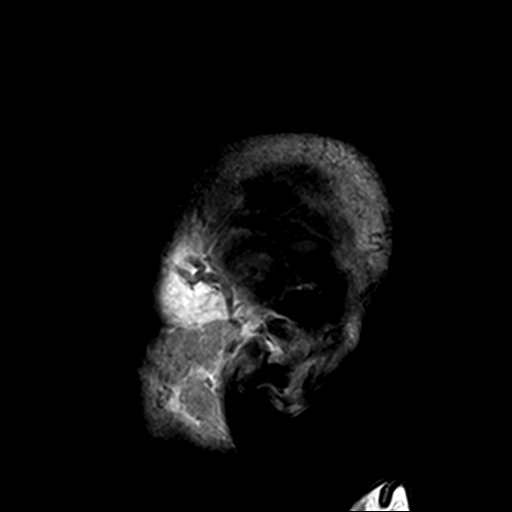
[im 21/21]
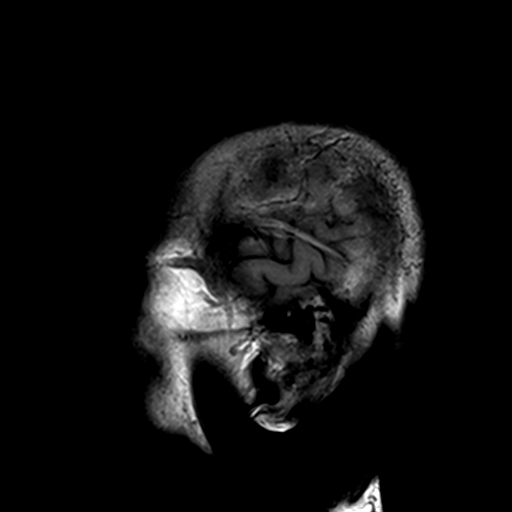

[Series 3: DWI · axial · 3.0mm · 0.82mm/px · z∈[-86,+73]mm · 5 of 54 slices shown (1 of 4)]
[im 1/54]
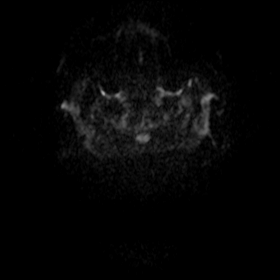
[im 14/54]
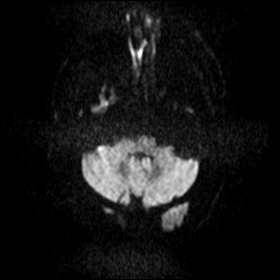
[im 27/54]
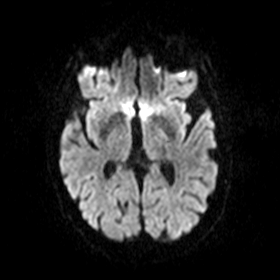
[im 40/54]
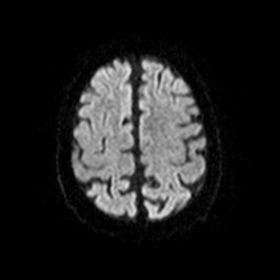
[im 54/54]
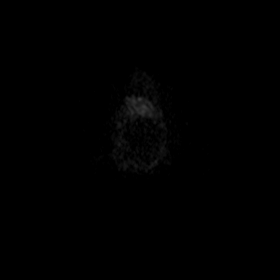

[Series 4: DWI · axial · 3.0mm · 0.82mm/px · z∈[-89,+73]mm · 5 of 54 slices shown (2 of 4)]
[im 1/54]
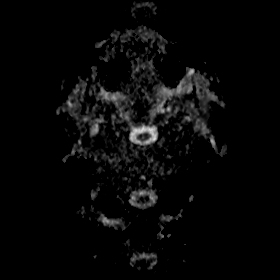
[im 14/54]
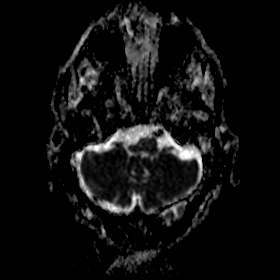
[im 27/54]
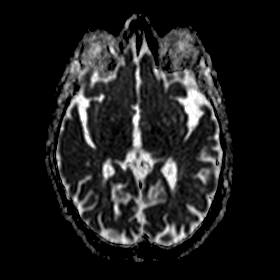
[im 40/54]
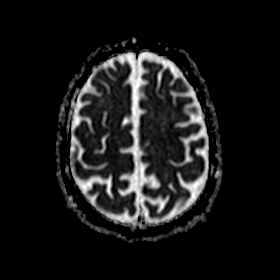
[im 54/54]
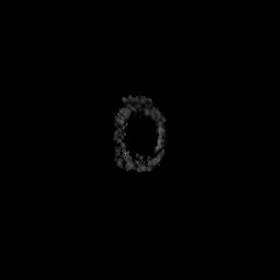

[Series 5: DWI · coronal · 5.0mm · 0.60mm/px · 3 of 34 slices shown (3 of 4)]
[im 1/34]
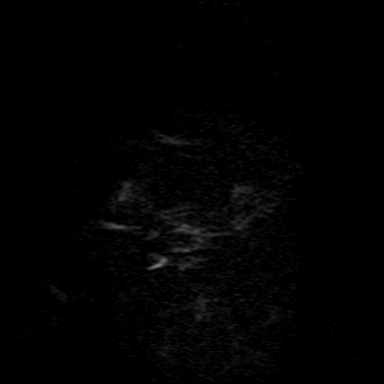
[im 17/34]
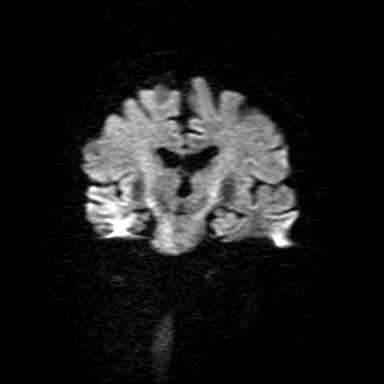
[im 34/34]
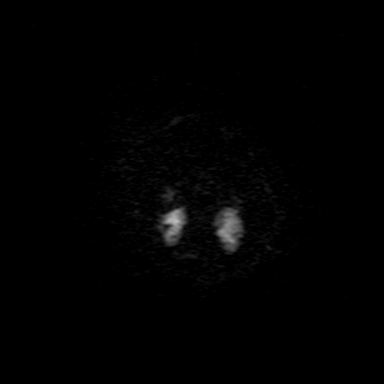

[Series 6: DWI · coronal · 5.0mm · 0.60mm/px · 3 of 34 slices shown (4 of 4)]
[im 1/34]
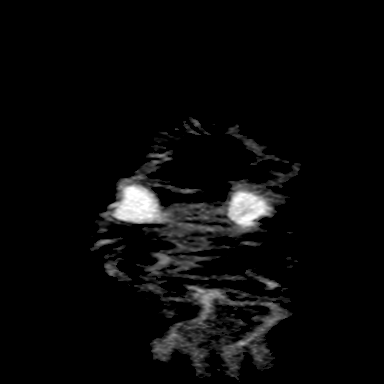
[im 17/34]
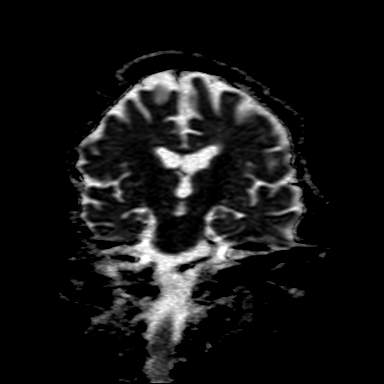
[im 34/34]
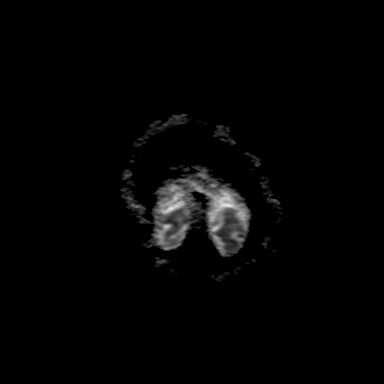

[Series 7: T2 · axial · 5.0mm · 0.51mm/px · z∈[-82,+61]mm · 2 of 23 slices shown (1 of 2)]
[im 1/23]
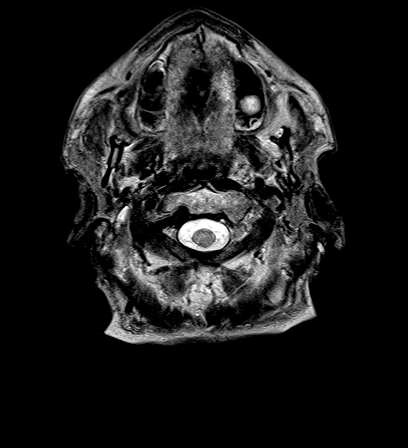
[im 23/23]
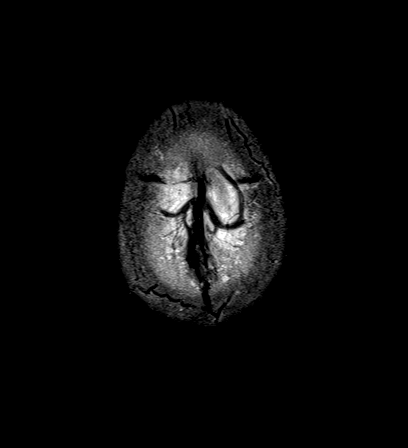

[Series 8: FLAIR · axial · 5.0mm · 0.94mm/px · z∈[-82,+61]mm · 2 of 23 slices shown]
[im 1/23]
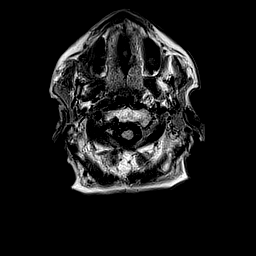
[im 23/23]
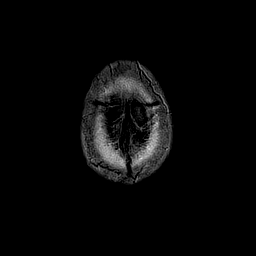

[Series 9: T1 · axial · 2.0mm · 0.47mm/px · z∈[-97,-73]mm · 2 of 88 slices shown (2 of 2)]
[im 1/88]
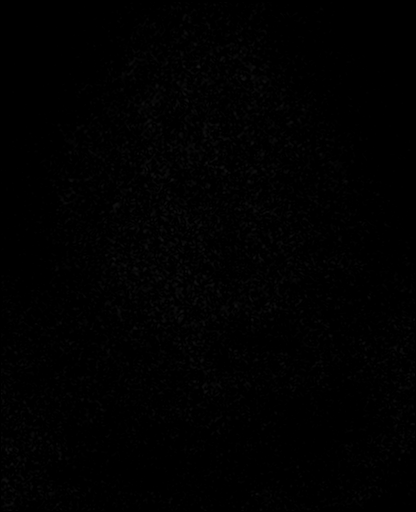
[im 13/88]
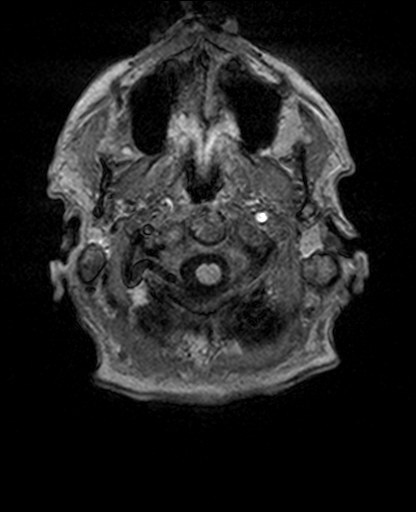

[Series 11: T2 · coronal · 5.0mm · 0.75mm/px · 3 of 28 slices shown (2 of 2)]
[im 1/28]
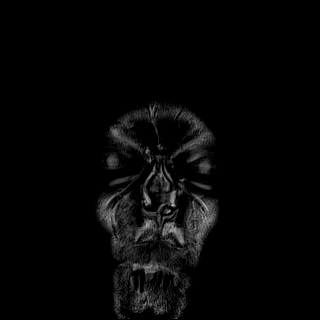
[im 14/28]
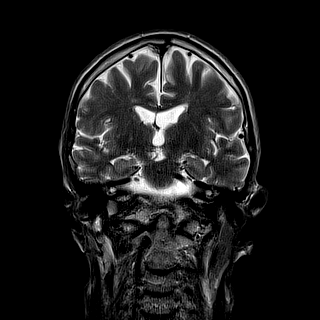
[im 28/28]
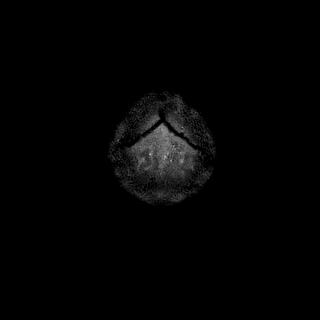

[Series 13: T1 post-contrast · coronal · 5.0mm · 0.49mm/px · 3 of 28 slices shown (1 of 2)]
[im 1/28]
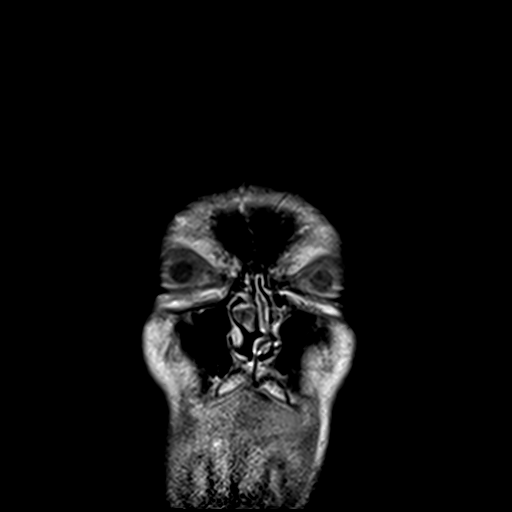
[im 14/28]
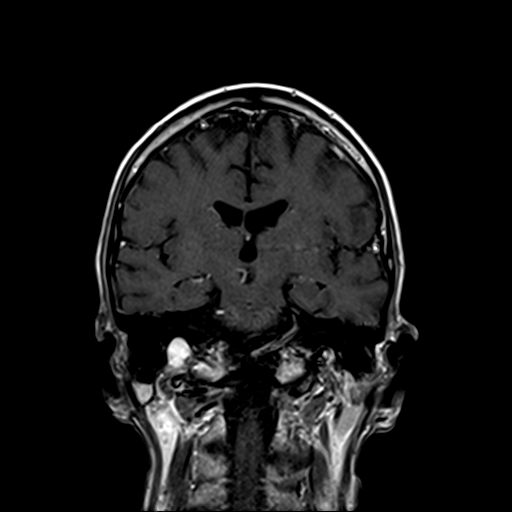
[im 28/28]
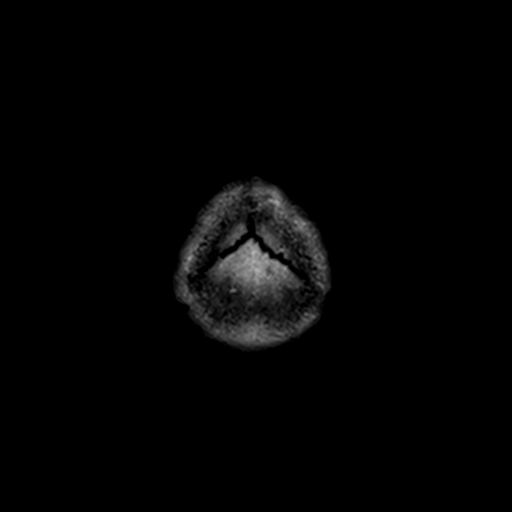

[Series 14: T1 post-contrast · sagittal · 5.0mm · 0.43mm/px · 2 of 20 slices shown (2 of 2)]
[im 1/20]
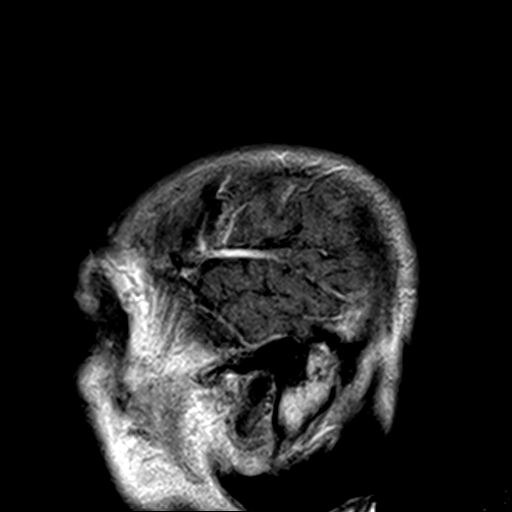
[im 20/20]
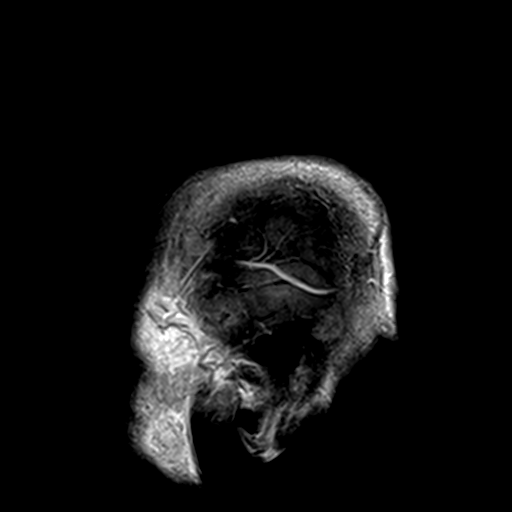

[32 of 48 positions shown; findings below may reference images not displayed]

FINDINGS: Brain: Generalized age-related cerebral atrophy. Mild T2/FLAIR
hyperintensity within the periventricular and deep white matter both
cerebral hemispheres, most consistent with chronic small vessel
ischemic change, mild for age. No evidence for acute or subacute
infarct. Gray-white matter differentiation maintained. Small remote
right cerebellar infarct. No other areas of remote cortical
infarction. No acute intracranial hemorrhage. Subcentimeter focus of
susceptibility artifact within the central pons, consistent with a
small chronic microhemorrhage, of doubtful significance in
isolation.

No mass lesion, midline shift, or mass effect. No hydrocephalus. No
extra-axial fluid collection. No abnormal enhancement. No evidence
for intracranial metastatic disease. Pituitary within normal limits.

Vascular: Major intracranial vascular flow voids maintained left
vertebral artery hypoplastic and likely terminates in PICA.

Skull and upper cervical spine: Craniocervical junction within
normal limits. Upper cervical spine normal. Single 5 mm T2
hyperintense enhancing lesion at the right frontal calvarium (series
12, image 62), indeterminate. No other discrete osseous lesion.
Scalp soft tissues unremarkable.

Sinuses/Orbits: Globes and orbital soft tissues within normal
limits. Left maxillary sinus retention cyst. Paranasal sinuses are
otherwise clear. Left mastoid effusion, likely sterile/benign. Inner
ear structures normal.

Other: None.
IMPRESSION: 1. No evidence for intracranial metastatic disease.
2. Single 5 mm, indeterminate, but favored to reflect a benign
etiology. Correlation with bone scan suggested for further
evaluation. At the right frontal calvarium
3. Age-related cerebral atrophy with mild chronic small vessel
ischemic disease, with small remote right cerebellar infarct.
